# Patient Record
Sex: Female | Born: 1970 | Race: White | Hispanic: No | Marital: Single | Smoking: Never smoker
Health system: Southern US, Community
[De-identification: ages and names within clinical notes are randomized; demographics above are authoritative.]

## PROBLEM LIST (undated history)

## (undated) DIAGNOSIS — F32A Depression, unspecified: Secondary | ICD-10-CM

## (undated) DIAGNOSIS — I10 Essential (primary) hypertension: Secondary | ICD-10-CM

## (undated) DIAGNOSIS — T7840XA Allergy, unspecified, initial encounter: Secondary | ICD-10-CM

## (undated) DIAGNOSIS — Z86718 Personal history of other venous thrombosis and embolism: Secondary | ICD-10-CM

## (undated) DIAGNOSIS — D689 Coagulation defect, unspecified: Secondary | ICD-10-CM

## (undated) DIAGNOSIS — K219 Gastro-esophageal reflux disease without esophagitis: Secondary | ICD-10-CM

## (undated) DIAGNOSIS — F419 Anxiety disorder, unspecified: Secondary | ICD-10-CM

## (undated) DIAGNOSIS — E785 Hyperlipidemia, unspecified: Secondary | ICD-10-CM

## (undated) DIAGNOSIS — E7800 Pure hypercholesterolemia, unspecified: Secondary | ICD-10-CM

## (undated) DIAGNOSIS — F410 Panic disorder [episodic paroxysmal anxiety] without agoraphobia: Secondary | ICD-10-CM

## (undated) DIAGNOSIS — F329 Major depressive disorder, single episode, unspecified: Secondary | ICD-10-CM

## (undated) HISTORY — PX: TONSILLECTOMY: SUR1361

## (undated) HISTORY — PX: BREAST ENHANCEMENT SURGERY: SHX7

## (undated) HISTORY — PX: ABDOMINAL HYSTERECTOMY: SHX81

## (undated) HISTORY — DX: Allergy, unspecified, initial encounter: T78.40XA

## (undated) HISTORY — DX: Coagulation defect, unspecified: D68.9

---

## 2005-07-26 ENCOUNTER — Encounter: Admission: RE | Admit: 2005-07-26 | Discharge: 2005-07-26 | Payer: Self-pay | Admitting: Family Medicine

## 2006-08-17 ENCOUNTER — Encounter: Admission: RE | Admit: 2006-08-17 | Discharge: 2006-08-17 | Payer: Self-pay | Admitting: Internal Medicine

## 2007-05-31 ENCOUNTER — Encounter: Admission: RE | Admit: 2007-05-31 | Discharge: 2007-05-31 | Payer: Self-pay | Admitting: Internal Medicine

## 2007-07-11 ENCOUNTER — Encounter: Admission: RE | Admit: 2007-07-11 | Discharge: 2007-07-11 | Payer: Self-pay | Admitting: *Deleted

## 2008-05-12 ENCOUNTER — Ambulatory Visit (HOSPITAL_COMMUNITY): Admission: RE | Admit: 2008-05-12 | Discharge: 2008-05-12 | Payer: Self-pay | Admitting: *Deleted

## 2008-05-12 ENCOUNTER — Encounter (INDEPENDENT_AMBULATORY_CARE_PROVIDER_SITE_OTHER): Payer: Self-pay | Admitting: *Deleted

## 2008-06-11 ENCOUNTER — Ambulatory Visit: Payer: Self-pay | Admitting: Internal Medicine

## 2008-06-11 ENCOUNTER — Inpatient Hospital Stay (HOSPITAL_COMMUNITY): Admission: EM | Admit: 2008-06-11 | Discharge: 2008-06-18 | Payer: Self-pay | Admitting: Emergency Medicine

## 2008-06-12 ENCOUNTER — Ambulatory Visit: Payer: Self-pay | Admitting: Surgery

## 2008-06-12 ENCOUNTER — Encounter (INDEPENDENT_AMBULATORY_CARE_PROVIDER_SITE_OTHER): Payer: Self-pay | Admitting: Internal Medicine

## 2008-06-19 ENCOUNTER — Emergency Department (HOSPITAL_COMMUNITY): Admission: EM | Admit: 2008-06-19 | Discharge: 2008-06-19 | Payer: Self-pay | Admitting: *Deleted

## 2008-06-19 ENCOUNTER — Telehealth: Payer: Self-pay | Admitting: Internal Medicine

## 2008-07-18 ENCOUNTER — Ambulatory Visit (HOSPITAL_COMMUNITY): Admission: RE | Admit: 2008-07-18 | Discharge: 2008-07-18 | Payer: Self-pay | Admitting: Internal Medicine

## 2008-07-18 ENCOUNTER — Ambulatory Visit: Payer: Self-pay | Admitting: Internal Medicine

## 2008-07-18 DIAGNOSIS — I2699 Other pulmonary embolism without acute cor pulmonale: Secondary | ICD-10-CM | POA: Insufficient documentation

## 2008-07-21 ENCOUNTER — Telehealth: Payer: Self-pay | Admitting: Internal Medicine

## 2008-07-24 ENCOUNTER — Ambulatory Visit: Payer: Self-pay | Admitting: Oncology

## 2008-07-30 ENCOUNTER — Telehealth: Payer: Self-pay | Admitting: Internal Medicine

## 2008-08-15 ENCOUNTER — Encounter: Payer: Self-pay | Admitting: Internal Medicine

## 2008-08-20 ENCOUNTER — Encounter: Payer: Self-pay | Admitting: Internal Medicine

## 2008-08-20 LAB — COMPREHENSIVE METABOLIC PANEL
ALT: 17 U/L (ref 0–35)
Albumin: 4.6 g/dL (ref 3.5–5.2)
CO2: 27 mEq/L (ref 19–32)
Calcium: 9.1 mg/dL (ref 8.4–10.5)
Chloride: 102 mEq/L (ref 96–112)
Potassium: 4.1 mEq/L (ref 3.5–5.3)
Sodium: 139 mEq/L (ref 135–145)
Total Bilirubin: 0.6 mg/dL (ref 0.3–1.2)
Total Protein: 7.2 g/dL (ref 6.0–8.3)

## 2008-08-20 LAB — CBC & DIFF AND RETIC
Basophils Absolute: 0 10*3/uL (ref 0.0–0.1)
Eosinophils Absolute: 0.1 10*3/uL (ref 0.0–0.5)
HGB: 14.1 g/dL (ref 11.6–15.9)
LYMPH%: 26.6 % (ref 14.0–48.0)
MCH: 29.1 pg (ref 26.0–34.0)
MCV: 84.3 fL (ref 81.0–101.0)
MONO%: 7.2 % (ref 0.0–13.0)
NEUT#: 3.3 10*3/uL (ref 1.5–6.5)
NEUT%: 64.5 % (ref 39.6–76.8)
Platelets: 276 10*3/uL (ref 145–400)

## 2008-08-20 LAB — PROTHROMBIN TIME: INR: 2.9 — ABNORMAL HIGH (ref 0.0–1.5)

## 2008-08-26 LAB — BETA-2 GLYCOPROTEIN ANTIBODIES
Beta-2 Glyco I IgG: <4 U/mL (ref <20)
Beta-2-Glycoprotein I IgA: 5 U/mL (ref <10)

## 2008-08-26 LAB — FACTOR 11: Factor XI Activity: 114 % (ref 56–153)

## 2008-08-26 LAB — FACTOR 8 ASSAY: Coagulation Factor VIII: 232 % — ABNORMAL HIGH (ref 73–140)

## 2008-08-26 LAB — LUPUS ANTICOAGULANT PANEL: PTT Lupus Anticoagulant: 58.3 secs — ABNORMAL HIGH (ref 36.3–48.8)

## 2008-08-26 LAB — CARDIOLIPIN ANTIBODIES, IGG, IGM, IGA: Anticardiolipin IgA: <7 [APL'U] (ref <13)

## 2008-08-27 ENCOUNTER — Encounter: Payer: Self-pay | Admitting: Internal Medicine

## 2008-09-08 ENCOUNTER — Ambulatory Visit: Payer: Self-pay | Admitting: Internal Medicine

## 2008-09-08 DIAGNOSIS — R0602 Shortness of breath: Secondary | ICD-10-CM | POA: Insufficient documentation

## 2008-10-22 ENCOUNTER — Ambulatory Visit: Payer: Self-pay | Admitting: Oncology

## 2008-10-24 ENCOUNTER — Encounter: Payer: Self-pay | Admitting: Internal Medicine

## 2008-10-24 LAB — CBC WITH DIFFERENTIAL/PLATELET
BASO%: 0.3 % (ref 0.0–2.0)
Basophils Absolute: 0 10*3/uL (ref 0.0–0.1)
EOS%: 2.3 % (ref 0.0–7.0)
Eosinophils Absolute: 0.2 10*3/uL (ref 0.0–0.5)
HCT: 40.3 % (ref 34.8–46.6)
HGB: 13.8 g/dL (ref 11.6–15.9)
LYMPH%: 24.1 % (ref 14.0–48.0)
MCH: 29.6 pg (ref 26.0–34.0)
MCHC: 34.3 g/dL (ref 32.0–36.0)
MCV: 86.3 fL (ref 81.0–101.0)
MONO#: 0.4 10*3/uL (ref 0.1–0.9)
MONO%: 6.1 % (ref 0.0–13.0)
NEUT#: 4.5 10*3/uL (ref 1.5–6.5)
NEUT%: 67.2 % (ref 39.6–76.8)
Platelets: 263 10*3/uL (ref 145–400)
RBC: 4.67 10*6/uL (ref 3.70–5.32)
RDW: 14.5 % (ref 11.3–14.5)
WBC: 6.7 10*3/uL (ref 3.9–10.0)
lymph#: 1.6 10*3/uL (ref 0.9–3.3)

## 2008-10-24 LAB — D-DIMER, QUANTITATIVE: D-Dimer, Quant: 0.2 ug/mL-FEU (ref 0.00–0.48)

## 2008-10-24 LAB — PROTIME-INR
INR: 3 (ref 2.00–3.50)
Protime: 36 Seconds — ABNORMAL HIGH (ref 10.6–13.4)

## 2008-10-27 LAB — FACTOR 8 ASSAY: Coagulation Factor VIII: 172 % — ABNORMAL HIGH (ref 73–140)

## 2009-04-10 ENCOUNTER — Ambulatory Visit: Payer: Self-pay | Admitting: Oncology

## 2009-04-10 ENCOUNTER — Encounter: Payer: Self-pay | Admitting: Internal Medicine

## 2009-04-10 LAB — CBC WITH DIFFERENTIAL/PLATELET
BASO%: 0.4 % (ref 0.0–2.0)
Basophils Absolute: 0 10*3/uL (ref 0.0–0.1)
EOS%: 1.8 % (ref 0.0–7.0)
Eosinophils Absolute: 0.1 10*3/uL (ref 0.0–0.5)
HCT: 40.2 % (ref 34.8–46.6)
HGB: 14 g/dL (ref 11.6–15.9)
LYMPH%: 25.4 % (ref 14.0–49.7)
MCH: 30.8 pg (ref 25.1–34.0)
MCHC: 34.9 g/dL (ref 31.5–36.0)
MCV: 88.4 fL (ref 79.5–101.0)
MONO#: 0.4 10*3/uL (ref 0.1–0.9)
MONO%: 6 % (ref 0.0–14.0)
NEUT#: 4.1 10*3/uL (ref 1.5–6.5)
NEUT%: 66.4 % (ref 38.4–76.8)
Platelets: 251 10*3/uL (ref 145–400)
RBC: 4.55 10*6/uL (ref 3.70–5.45)
RDW: 13.6 % (ref 11.2–14.5)
WBC: 6.1 10*3/uL (ref 3.9–10.3)
lymph#: 1.5 10*3/uL (ref 0.9–3.3)

## 2009-04-10 LAB — PROTIME-INR
INR: 2.5 (ref 2.00–3.50)
Protime: 30 Seconds — ABNORMAL HIGH (ref 10.6–13.4)

## 2009-04-13 LAB — FACTOR 8 ASSAY: Coagulation Factor VIII: 136 % (ref 73–140)

## 2009-04-20 ENCOUNTER — Encounter: Payer: Self-pay | Admitting: Internal Medicine

## 2009-06-01 ENCOUNTER — Telehealth: Payer: Self-pay | Admitting: Internal Medicine

## 2009-06-16 ENCOUNTER — Encounter: Payer: Self-pay | Admitting: Internal Medicine

## 2009-07-06 ENCOUNTER — Ambulatory Visit: Payer: Self-pay | Admitting: Internal Medicine

## 2009-07-13 ENCOUNTER — Ambulatory Visit (HOSPITAL_COMMUNITY): Admission: RE | Admit: 2009-07-13 | Discharge: 2009-07-13 | Payer: Self-pay | Admitting: Pulmonary Disease

## 2009-07-13 ENCOUNTER — Encounter: Payer: Self-pay | Admitting: Internal Medicine

## 2009-07-28 ENCOUNTER — Telehealth: Payer: Self-pay | Admitting: Internal Medicine

## 2009-07-29 ENCOUNTER — Encounter: Payer: Self-pay | Admitting: Adult Health

## 2009-07-31 ENCOUNTER — Telehealth: Payer: Self-pay | Admitting: Internal Medicine

## 2010-04-15 ENCOUNTER — Ambulatory Visit: Payer: Self-pay | Admitting: Oncology

## 2010-04-19 ENCOUNTER — Encounter: Payer: Self-pay | Admitting: Internal Medicine

## 2010-04-19 LAB — CBC WITH DIFFERENTIAL/PLATELET
BASO%: 0.4 % (ref 0.0–2.0)
Basophils Absolute: 0 10*3/uL (ref 0.0–0.1)
EOS%: 1 % (ref 0.0–7.0)
Eosinophils Absolute: 0.1 10*3/uL (ref 0.0–0.5)
HCT: 42.2 % (ref 34.8–46.6)
HGB: 14.5 g/dL (ref 11.6–15.9)
LYMPH%: 20.7 % (ref 14.0–49.7)
MCH: 30.1 pg (ref 25.1–34.0)
MCHC: 34.4 g/dL (ref 31.5–36.0)
MCV: 87.6 fL (ref 79.5–101.0)
MONO#: 0.4 10*3/uL (ref 0.1–0.9)
MONO%: 5.5 % (ref 0.0–14.0)
NEUT#: 5.3 10*3/uL (ref 1.5–6.5)
NEUT%: 72.4 % (ref 38.4–76.8)
Platelets: 200 10*3/uL (ref 145–400)
RBC: 4.82 10*6/uL (ref 3.70–5.45)
RDW: 13.2 % (ref 11.2–14.5)
WBC: 7.3 10*3/uL (ref 3.9–10.3)
lymph#: 1.5 10*3/uL (ref 0.9–3.3)

## 2010-04-19 LAB — PROTIME-INR
INR: 2.1 (ref 2.00–3.50)
Protime: 25.2 Seconds — ABNORMAL HIGH (ref 10.6–13.4)

## 2010-11-02 NOTE — Letter (Signed)
[facility]   Imported By: Sherian Rein 05/10/2010 09:39:10  _____________________________________________________________________  External Attachment:    Type:   Image     Comment:   External Document

## 2011-02-15 NOTE — Group Therapy Note (Signed)
NAME:  Leah Campos, Leah Campos NO.:  [id number]   MEDICAL RECORD NO.:  [id number]          PATIENT TYPE:  INP   LOCATION:  4742                         FACILITY:  [facility]   PHYSICIAN:  Eduard Clos, MDDATE OF BIRTH:  [birth year]                                 PROGRESS NOTE   INTERIM DISCHARGE SUMMARY   HOSPITAL COURSE:  A 40 year old female who has a history of hyperlipidemia and depression,  who presented complaining of shortness of breath to her primary care  physician with some chest pain, and the patient had a CAT scan of the  chest done as an outpatient, which showed bilateral pulmonary emboli.  The patient admitted for further management.  The patient was started on  Lovenox and Coumadin.  Her lower extremity Dopplers are negative for any  DVT and the patient recently has been changed on her Coumadin dose per  pharmacy.  The patient also requested a pulmonology consult for further  assessment of her present condition, and Dr. Kalman Shan had seen  the patient in consult and recommended a repeat CT chest to evaluate for  her pulmonary effusion, and is going to follow as an outpatient for  further management.  Along with CT chest, Dr. Marchelle Gearing has also ordered  ANA, DNA, ESR, CRP.   The patient is being monitored for her Coumadin dosing and, once that  has been achieved, she will be discharged home with followup with  primary care pulmonologist.  Further recommendations as the patient's  condition evolves.   FINAL DIAGNOSIS:  1. Bilateral pulmonary emboli.  2. History of depression.  3. History of hyperlipidemia.  4. Bilateral pleural effusions.   PLAN:  Further recommendations based on her pending CT chest without contrast  and also blood workup that can be followed outpatient and, once her  Coumadin has been stabilized, the patient to be discharged with a  Coumadin dose, which should be decided by the discharging M.D. at the  time of  discharge.      Eduard Clos, MD  Electronically Signed     ANK/MEDQ  D:  06/17/2008  T:  06/17/2008  Job:  [id number]

## 2011-02-15 NOTE — Op Note (Signed)
Leah Campos, Leah Campos               ACCOUNT NO.:  [id number]   MEDICAL RECORD NO.:  [id number]          PATIENT TYPE:  AMB   LOCATION:  ENDO                         FACILITY[facility]   PHYSICIAN:  Georgiana Spinner, M.D.    DATE OF BIRTH:  11-Aug-1971   DATE OF PROCEDURE:  05/12/2008  DATE OF DISCHARGE:                               OPERATIVE REPORT   PROCEDURE:  Upper endoscopy.   INDICATIONS:  GERD.   ANESTHESIA:  1. Fentanyl 100 mcg.  2. Versed 9 mg.   PROCEDURE:  With the patient mildly sedated in the left lateral  decubitus position, the Pentax videoscopic endoscope was inserted in the  mouth and passed under direct vision through the esophagus, which  appeared normal on first view.  We entered into the stomach.  Fundus,  body, antrum, duodenal bulb, and second portion of the duodenum were  visualized.  From this point, the endoscope was slowly withdrawn, taking  circumferential views of the duodenal mucosa, until the endoscope had  been pulled back and the stomach placed in retroflexion to view the  stomach from below.  The endoscope was straightened and withdrawn,  taking circumferential views of the remaining gastric and esophageal  mucosa, stopping in the fundus of the stomach, where a polyp was seen,  photographed, and biopsied.  We next stopped in the distal esophagus,  where there was a questionable tongue of Barrett's esophagus versus  normal variant that had been pulled back subsequently.  The endoscope  was straightened and withdrawn.  The patient's vital signs and pulse  oximeter remained stable.  The patient tolerated the procedure well,  without apparent complications.   FINDINGS:  Question of Barrett's esophagus, gastric fundic polyp, both  biopsied, and loose wrap of the GE junction around the endoscope,  indicating laxity of the lower esophageal sphincter.  We could see up  the esophagus from the stomach on retroflexed view.   IMPRESSION:  Probable  gastroesophageal reflux, probable benign fundic  gland polyp, probably normal versus Barrett's esophagus seen.  Await  biopsy reports and clinical response to Zegerid.  Have the patient call  me for results and follow up with me as an outpatient.           ______________________________  Georgiana Spinner, M.D.     GMO/MEDQ  D:  05/12/2008  T:  05/12/2008  Job:  [id number]

## 2011-02-15 NOTE — Discharge Summary (Signed)
Leah Campos, Leah Campos               ACCOUNT NO.:  [id number]   MEDICAL RECORD NO.:  [id number]          PATIENT TYPE:  INP   LOCATION:  4742                         FACILITY:  [facility]   PHYSICIAN:  Altha Harm, MDDATE OF BIRTH:  10-Apr-1971   DATE OF ADMISSION:  06/11/2008  DATE OF DISCHARGE:  06/18/2008                               DISCHARGE SUMMARY   DISCHARGE DISPOSITION:  Home.   FINAL DISCHARGE DIAGNOSES:  1. Bilateral pulmonary emboli.  2. Pleural effusion.  3. Hyperlipidemia.  4. Gastroesophageal reflux disease.  5. Seasonal allergic rhinitis.  6. History of depression.   DISCHARGE MEDICATIONS INCLUDED:  1. Celexa 40 mg p.o. daily.  2. Zegerid 40 mg p.o. daily.  3. Lipitor 10 mg p.o. daily.  4. Zyrtec 10 mg p.o. daily p.r.n.  5. Vitamin D3 one tab p.o. daily.  6. Vitamin B complex one tab p.o. daily.  7. Coumadin 2.5 mg p.o. daily.   CONSULTANTS:  Kalman Shan, M.D., pulmonary.   DIAGNOSTIC STUDIES:  1. CT chest without contrast, which showed small bilateral pleural      effusions, left greater than right.  Associated air space disease      may represent atelectasis that extends beyond the area of      diffusion.  An infection or pleural-based disease, such as      asbestosis, is considered.  2. Portable chest x-ray, done on September 14, that shows stable      appearance of pleural effusion and bilateral lower lobe air space      disease.  3. Portable chest x-ray, done on September 12, which shows known      bilateral pleural effusions with worsening bilateral air space      disease.   PRIMARY CARE PHYSICIAN:  Soyla Murphy. Renne Crigler, M.D.   PULMONOLOGIST:  Kalman Shan, M.D.   CHIEF COMPLAINT:  Shortness of breath.   HISTORY OF PRESENT ILLNESS:  Please see the H and P, dictated by Dr.  Sharon Seller at time of admission for details of the HPI.   HOSPITAL COURSE:  The patient was admitted and given supportive care.  The patient was treated with Lovenox and  Coumadin until she became  therapeutic.  The patient has been therapeutic on Coumadin for the last  48 hours, INR 2.8 today and 2.5 yesterday with a dose of 3 mg of  Coumadin given last night.  The patient is now clinically stable.  She  does not require any O2 supplementation.   DIETARY RESTRICTIONS:  The patient should be on a vitamin K restricted  diet.   PHYSICAL RESTRICTIONS:  None.   FOLLOWUP:  The patient is to follow up with Dr. Marchelle Gearing in the office  for an appointment scheduled for October 16, and to follow up with Dr.  Renne Crigler on a p.r.n. basis.   Please note that the patient had been on Seasonique birth control pills,  which have been discontinued at this time.      Altha Harm, MD  Electronically Signed     MAM/MEDQ  D:  06/18/2008  T:  06/18/2008  Job:  [id number]   cc:   Soyla Murphy. Renne Crigler, M.D.  Kalman Shan, MD

## 2011-02-15 NOTE — H&P (Signed)
NAMEEVALISE, ABRUZZESE NO.:  [id number]   MEDICAL RECORD NO.:  [id number]          PATIENT TYPE:  EMS   LOCATION:  MAJO                         FACILITY:  [facility]   PHYSICIAN:  Lonia Blood, M.D.DATE OF BIRTH:  18-Jul-1971   DATE OF ADMISSION:  06/11/2008  DATE OF DISCHARGE:                              HISTORY & PHYSICAL   PRIMARY CARE PHYSICIAN:  Merri Brunette, MD.   CHIEF COMPLAINT:  Multiple bilateral pulmonary emboli.   HISTORY OF PRESENT ILLNESS:  Leah Campos is a very pleasant 40-  year-old female with no significant past medical history with exception  to those few issues detailed below.  She reports somewhat of a rambling  history but she relates that she has had symptoms of shortness of breath  dating back as far as 8 months.  This apparently was worse with  exertion.  She has made multiple visits to her primary care physician  and these have all been investigated to a great extent with different  issues being felt to be related to the source of her symptoms.  Thought  it is exactly hard to pin down when she began to experience chest pain,  she does admit that she had a severe acute onset of left-sided chest  pain, like none she has experienced before last night.  She does admit  to having pinpoint peripheral type pain in both the right and left chest  for many months now, but again does state that the pain she experienced  last night was focused primarily on the left side and was much more  severe than any she has experienced before.  It was a sharp stabbing  pain.  With it was a severe increase in the shortness of breath that she  has been experiencing for many months now.  Due to the fact that her  primary care physician's office is only open for part of the day today,  she presented to the Urgent Care at [facility] around 11:00 a.m.Marland Kitchen  Apparently, the patient was sent from there to [facility]  where a CT scan was done.  The  patient presents with that CT scan which  does reveal bilateral multiple pulmonary emboli.  When the results were  called to Urgent Care, an ambulance was called and the patient was  transported to the emergency room.  She has already received a dose of  Lovenox.  She is somewhat short of breath when attempting to provide her  history, but otherwise appears to be hemodynamically stable.   The patient has no known family history of clotting disorders.  She has  no previous history of blood clots herself.  She has no bleeding  disorders.  She did begin Seasonique (an oral hormone replacement  therapy) approximately 3 months ago and now states, in retrospect, that  she thinks she began having chest pain shortly thereafter.  There has  been no hemoptysis or hematemesis.   REVIEW OF SYSTEMS:  Comprehensive review of systems is unremarkable with  exception to the acute issues noted above.   PAST MEDICAL HISTORY:  1. Gastroesophageal reflux disease status post EGD May 12, 2008,      per Dr. Sabino Gasser.  2. Hypercholesterolemia - patient stopped her therapy herself.  3. Status post partial hysterectomy 1993 secondary to endometriosis.   OUTPATIENT MEDICATIONS:  1. Celexa 40 mg p.o. daily.  2. Zegerid daily.  3. Lipitor - patient stopped herself a month or so ago.  4. Seasonique daily - patient is presently in her third month.   ALLERGIES:  PENICILLIN.   FAMILY HISTORY:  Patient's mother is alive with fibromyalgia and  hypercholesterolemia.  The patient's father is alive with diabetes and  hypertension.  As stated before, there is no known history of blood  clotting or clotting disorders.   SOCIAL HISTORY:  The patient denies smoking now or in the past.  She has  2 healthy children who are both female.  She occasionally partakes of  alcohol.  She is in a steady relationship with a boyfriend.  She works  as an Airline pilot.   DATA REVIEW:  White count is elevated at 11.9 with a normal  hemoglobin,  normal platelet count and normal MCV.  BMET is wholly unremarkable,  coags are normal.  Chest x-ray reveals vague airspace disease at the  left lung base.   PHYSICAL EXAMINATION:  Temperature 99.0, blood pressure 129/87, heart  rate 122, respiratory 21, O2 sat 95% on room air.  GENERAL:  A well-developed, well-nourished female in mild respiratory  distress in that she cannot complete a full sentence without stopping  for a breath, though the patient is not tachypneic.  HEENT:  Normocephalic, atraumatic.  Pupils equal, round, react to light  and accommodation.  Extraocular muscles intact bilaterally.  OC/OP  clear.  NECK:  No JVD, no lymphadenopathy.  LUNGS:  Clear to auscultation bilaterally without wheezing, rhonchi.  CARDIOVASCULAR:  Tachycardic but regular, without gallop or rub, with  normal S1-S2, no murmur.  ABDOMEN:  Nontender, nondistended, soft, bowel sounds present, no  hepatosplenomegaly, no rebound, no ascites.  EXTREMITIES:  No significant cyanosis, clubbing, edema in bilateral  lower extremities with no evidence of erythema, no cords palpable.  NEUROLOGIC:  Alert and oriented x4.  Cranial nerves II-XII intact  bilaterally, 5/5 strength bilateral upper and lower extremities, intact  sensation to touch throughout.   IMPRESSION AND PLAN:  1. Multiple bilateral pulmonary emboli - the patient's history dates      back to a least 8 months and possibly more.  Her history is      somewhat disjointed.  It is essentially impossible to connect the      exact timing of the onset of her symptoms because she has been      complaining of chest and respiratory symptoms for at least 8 months      and possibly a year now.  She has only been on the [place] for 3      months.  Certainly, this appears to be her only obvious risk factor      for pulmonary embolism/deep vein thrombosis at the present time.      It is quite possible, however, that she has been throwing small       pulmonary embolisms for many, many months now even before the      [facility].  In that the patient has already been dosed with      Lovenox, my recommendation is going to be to complete a full 6      months of anticoagulation.  At  the completion of the 6 months, I      would discontinue all anticoagulation and then check the patient      for genetic abnormalities that could lead to hypercoagulable state      as well as a full hypercoagulable workup.  I have discussed with      her the need for a heparin drip with overlap of Coumadin and the      need for her to be in the hospital initially for close monitoring      given her clot burden.  We will obtain bilateral lower extremity      venous Dopplers to assess for the presence of more clot, but at the      present time clinically the patient does not appear to be suffering      with lower extremity DVTs.  Should she display a severe clot      burden, however, we will need to consider the placement of an      inferior vena cava filter.  2. Hypercholesterolemia - we will not resume the patient's Lipitor at      present, but we will check a fasting lipid panel in the morning and      consider resuming this medication as indicated.  3. Gastroesophageal reflux disease - proton pump inhibitor will be      administered during the hospital stay.  4. Leukocytosis - this is felt to be simply an inflammatory response      to the patient's multiple bilateral pulmonary emboli.  We will      follow this trend during hospital stay.      Lonia Blood, M.D.  Electronically Signed     JTM/MEDQ  D:  06/11/2008  T:  06/11/2008  Job:  [id number]   cc:   Soyla Murphy. Renne Crigler, M.D.

## 2011-02-15 NOTE — Consult Note (Signed)
[name], Campos               ACCOUNT NO.:  [id number]   MEDICAL RECORD NO.:  [id number]          PATIENT TYPE:  INP   LOCATION:  4742                         FACILITY:  [facility]   PHYSICIAN:  Kalman Shan, MD   DATE OF BIRTH:  06-09-1971   DATE OF CONSULTATION:  06/17/2008  DATE OF DISCHARGE:                                 CONSULTATION   TIME OF CONSULTATION:  11:10 a.m. to 11:50 a.m., June 17, 2008, 40  minute inpatient consultation.   CONSULTATION REQUESTED BY:  [facility], Eduard Clos, MD.   REASON FOR CONSULTATION:  The patient requesting second opinion on  pulmonary embolus and management, and pleural effusions.   PRIMARY CARE PHYSICIAN:  Soyla Murphy. Renne Crigler, M.D.   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  Two-three months prior to presentation, she was  started on Seasonique which is oral contraceptive pill.  In addition  since April because of the tax season, she has been extremely sedentary  and seated for long hours in her desk job.  She presented to Urgent Care  at [facility] and was shown to have bilateral multiple pulmonary emboli.  I  do not have the CT scan with me but did read the report.  She has been  started on anticoagulation with Lovenox and Coumadin.  Her INRs became  supratherapeutic and now the Coumadin levels are being titrated down, so  she still has been in the hospital.  Hospital course has been largely  uneventful other than the fact between June 11, 2008, a left lower  lobe infiltrate has now progressed on June 14, 2008 to a left lower  lobe effusion with small right lower lobe right-sided effusion.  By  June 16, 2008, seems to have predominantly left greater than right  effusion with some atelectasis.  She is requesting pulmonary  consultation for etiology of pulmonary embolism, management of  pulmonary  embolism, follow up of pulmonary embolism and expectations.   PAST MEDICAL HISTORY:  1. Gastroesophageal reflux disease, status post EGD May 12, 2008 by      Dr. Virginia Rochester.  2. Hyperlipidemia.  3. Status post partial hysterectomy in 1993 secondary to      endometriosis.   OUTPATIENT MEDICATIONS:  1. Celexa.  2. Zegerid.  3. Lipitor.  4. Seasonique.   CURRENT MEDICATIONS:  1. Warfarin.  2. Protonix.  3. Celexa.  4. Senna.  5. Lovenox.  6. Nasonex.  7. Coumadin.  8. Zofran.  9. Antacids p.r.n.  10.Albuterol p.r.n.Marland Kitchen  11.Guaifenesin.  12.Oxycodone p.r.n.  13.Morphine sulfate p.r.n.  14.Ambien p.r.n.  15.Ativan p.r.n.   ALLERGIES:  PENICILLIN.   FAMILY HISTORY:  No family history of any DVTs.  She is of [place].  As best as she can recollect, mother has  fibromyalgia and hyperlipidemia.   SOCIAL HISTORY:  She is an Airline pilot.  She has a sedentary life.  She  was sitting in the chair for many long hours  during tax season in April.  She is in a steady relationship with boyfriend, 2 healthy children, both  are teenagers.  Occasional alcohol.   REVIEW OF SYSTEMS:  As outlined in history of present illness.  Currently, gets tired and washed out after taking a shower.   PHYSICAL EXAMINATION:  VITAL SIGNS:  Temperature 98, pulse of 81,  respiratory rate 20, blood pressure 108/66, saturating 94% on room air.  GENERAL:  Well-built, well-nourished female.  PSYCHIATRIC:  Flat affect.  HEENT:  Neck is supple.  No neck nodes.  No elevated JVP.  Normal oral  cavity.  No ulcers.  No malar rash.  LUNGS:  Clear to auscultation, but decreased at the bases with some  crackles.  CARDIOVASCULAR:  Normal heart sounds.  Regular rate and rhythm.  No  murmurs.  EXTREMITIES:  No cyanosis, no clubbing, no edema.  ABDOMEN:  Soft, nontender.  NEUROLOGICALLY:  Alert and oriented.   DIAGNOSTICS:  1. Chest x-ray is as outlined in the history of present  illness.      Chest x-ray on June 11, 2008 shows small left lower lobe      infiltrate, this has progressed to left greater than right      effusion.  2. CT scan done at [facility] showing bilateral pulmonary      emboli.  I do not have this with me.   LABORATORY DATA:  Current INR is 2.5.  BMET shows a normal creatinine of  0.6.  Homocysteine levels are normal at 3.7.  CBC shows normal  hemoglobin 11.6 and platelet count of 285.  Beta hCG on June 11, 2008 was negative for pregnancy.  BNP on  June 11, 2008 was normal at 34.   ASSESSMENT/PLAN:  1. Bilateral multiple pulmonary emboli with a normal BNP, normal blood      pressure and normal oxygen levels, being currently treated on      Lovenox with Coumadin.  Currently awaiting discharge once INR      levels are stable.  Coumadin management being done by pharmacy.      Doppler of the legs is negative.  2. First episode of pulmonary embolism.  Risk factors are ethnicity,      sedentary lifestyle because of recent tax season and also being on      [place].  However,  symptoms preceded being on Seasonique oral      contraceptive pill.  3. No evidence of lupus.  No evidence of clinical malignancy.  The      last mammogram was many years ago.  4. Bilateral L>R effusions - likely from pulmonary embolism   PLAN:  1. Would continue with Coumadin for at least 9 months.  2. Post-Coumadin therapy:  Recommend hematology consultation for      evaluating for any clotting disorders resulting in pulmonary      embolism.  3. No more Seasonique.  4. Outpatient pulmonary followup for monitoring for dyspnea and a 5%      incidence of pulmonary hypertension.  5. No indication to do advanced malignancy screening other than      routine mammogram and  stool occult blood.  6. For current pleural effusions, would recommend checking BNP,      troponin and another CT scan of the chest to get a better sense of      the pleural  effusions.  7. [place] incentive spirometer 10 times every 1 hour to get rid of  bilateral atelectasis and to prevent pneumonia formation.  I      answered all her questions.  This is outlined in my plan.   FOLLOW UP:  She should follow up with me in 1 month, Kalman Shan,  MD at [address] at [facility].  Phone number [phone].  Will follow as inpatient  Need outside CT chest to confirm PE diagnosis.      Kalman Shan, MD  Electronically Signed     MR/MEDQ  D:  06/17/2008  T:  06/17/2008  Job:  [id number]   cc:   Lonia Blood, M.D.  Eduard Clos, MD  Soyla Murphy. Renne Crigler, M.D.

## 2011-07-04 LAB — CBC
HCT: 40.9
Hemoglobin: 13.7
MCHC: 33.5
MCHC: 34
MCV: 85.4
Platelets: 468 — ABNORMAL HIGH
RDW: 12.7
RDW: 12.9

## 2011-07-04 LAB — POCT CARDIAC MARKERS: CKMB, poc: <1 — ABNORMAL LOW

## 2011-07-04 LAB — BASIC METABOLIC PANEL
BUN: 15
CO2: 24
Chloride: 108
Creatinine, Ser: 0.7

## 2011-07-04 LAB — B-NATRIURETIC PEPTIDE (CONVERTED LAB): Pro B Natriuretic peptide (BNP): <30

## 2011-07-04 LAB — PROTIME-INR
INR: 2.5 — ABNORMAL HIGH
INR: 2.5 — ABNORMAL HIGH
Prothrombin Time: 29.1 — ABNORMAL HIGH

## 2011-07-04 LAB — ANA: Anti Nuclear Antibody(ANA): NEGATIVE

## 2011-07-04 LAB — DIFFERENTIAL
Basophils Absolute: 0
Basophils Relative: 1
Eosinophils Absolute: 0.2
Neutrophils Relative %: 62

## 2011-07-04 LAB — SEDIMENTATION RATE: Sed Rate: 20

## 2011-07-04 LAB — ANTI-NEUTROPHIL ANTIBODY

## 2011-07-06 LAB — BASIC METABOLIC PANEL
BUN: 3 — ABNORMAL LOW
CO2: 25
CO2: 26
Calcium: 8.1 — ABNORMAL LOW
Calcium: 8.8
Chloride: 104
Chloride: 105
GFR calc Af Amer: >60
GFR calc Af Amer: >60
GFR calc Af Amer: >60
GFR calc non Af Amer: >60
Glucose, Bld: 105 — ABNORMAL HIGH
Potassium: 3.9
Potassium: 4.2
Sodium: 136
Sodium: 137

## 2011-07-06 LAB — CBC
HCT: 32.6 — ABNORMAL LOW
HCT: 34 — ABNORMAL LOW
Hemoglobin: 11.1 — ABNORMAL LOW
Hemoglobin: 12.1
Hemoglobin: 13.5
MCHC: 33.7
MCHC: 34
MCV: 86.8
Platelets: 285
RBC: 3.75 — ABNORMAL LOW
RBC: 3.92
RBC: 4.05
RBC: 4.56
RDW: 13.1
WBC: 4.7

## 2011-07-06 LAB — COMPREHENSIVE METABOLIC PANEL
ALT: 15
Alkaline Phosphatase: 59
BUN: 5 — ABNORMAL LOW
CO2: 26
GFR calc non Af Amer: >60
Glucose, Bld: 96
Potassium: 4.5
Sodium: 136
Total Bilirubin: 1.2
Total Protein: 6.1

## 2011-07-06 LAB — URINALYSIS, ROUTINE W REFLEX MICROSCOPIC
Leukocytes, UA: NEGATIVE
Nitrite: NEGATIVE
Specific Gravity, Urine: >1.046 — ABNORMAL HIGH
Urobilinogen, UA: 1

## 2011-07-06 LAB — MAGNESIUM: Magnesium: 2.1

## 2011-07-06 LAB — DIFFERENTIAL
Basophils Relative: 0
Monocytes Absolute: 0.4
Monocytes Relative: 3
Neutro Abs: 10.5 — ABNORMAL HIGH

## 2011-07-06 LAB — URINE MICROSCOPIC-ADD ON

## 2011-07-06 LAB — LIPID PANEL
Total CHOL/HDL Ratio: 5.4
VLDL: 24

## 2011-07-06 LAB — HCG, SERUM, QUALITATIVE: Preg, Serum: NEGATIVE

## 2011-07-06 LAB — PROTIME-INR
INR: 1.8 — ABNORMAL HIGH
INR: 2.1 — ABNORMAL HIGH
Prothrombin Time: 20.4 — ABNORMAL HIGH
Prothrombin Time: 21.5 — ABNORMAL HIGH

## 2011-07-06 LAB — HOMOCYSTEINE: Homocysteine: 3.7 — ABNORMAL LOW

## 2011-07-06 LAB — APTT: aPTT: 24

## 2011-11-29 ENCOUNTER — Encounter (HOSPITAL_COMMUNITY): Payer: Self-pay | Admitting: *Deleted

## 2011-11-29 ENCOUNTER — Emergency Department (HOSPITAL_COMMUNITY)
Admission: EM | Admit: 2011-11-29 | Discharge: 2011-11-29 | Disposition: A | Discharge status: Home / Self Care | Payer: BC Managed Care – PPO | Attending: Emergency Medicine | Admitting: Emergency Medicine

## 2011-11-29 ENCOUNTER — Emergency Department (HOSPITAL_COMMUNITY): Payer: BC Managed Care – PPO

## 2011-11-29 DIAGNOSIS — Z7901 Long term (current) use of anticoagulants: Secondary | ICD-10-CM | POA: Insufficient documentation

## 2011-11-29 DIAGNOSIS — E7800 Pure hypercholesterolemia, unspecified: Secondary | ICD-10-CM | POA: Insufficient documentation

## 2011-11-29 DIAGNOSIS — Z86718 Personal history of other venous thrombosis and embolism: Secondary | ICD-10-CM | POA: Insufficient documentation

## 2011-11-29 DIAGNOSIS — R509 Fever, unspecified: Secondary | ICD-10-CM | POA: Insufficient documentation

## 2011-11-29 DIAGNOSIS — R109 Unspecified abdominal pain: Secondary | ICD-10-CM | POA: Insufficient documentation

## 2011-11-29 DIAGNOSIS — R197 Diarrhea, unspecified: Secondary | ICD-10-CM

## 2011-11-29 DIAGNOSIS — R10817 Generalized abdominal tenderness: Secondary | ICD-10-CM | POA: Insufficient documentation

## 2011-11-29 HISTORY — DX: Gastro-esophageal reflux disease without esophagitis: K21.9

## 2011-11-29 HISTORY — DX: Panic disorder (episodic paroxysmal anxiety): F41.0

## 2011-11-29 HISTORY — DX: Personal history of other venous thrombosis and embolism: Z86.718

## 2011-11-29 HISTORY — DX: Pure hypercholesterolemia, unspecified: E78.00

## 2011-11-29 HISTORY — DX: Anxiety disorder, unspecified: F41.9

## 2011-11-29 HISTORY — DX: Major depressive disorder, single episode, unspecified: F32.9

## 2011-11-29 HISTORY — DX: Depression, unspecified: F32.A

## 2011-11-29 LAB — COMPREHENSIVE METABOLIC PANEL WITH GFR
ALT: 41 U/L — ABNORMAL HIGH (ref 0–35)
AST: 22 U/L (ref 0–37)
Albumin: 4.3 g/dL (ref 3.5–5.2)
Alkaline Phosphatase: 56 U/L (ref 39–117)
BUN: 11 mg/dL (ref 6–23)
CO2: 24 meq/L (ref 19–32)
Calcium: 8.9 mg/dL (ref 8.4–10.5)
Chloride: 100 meq/L (ref 96–112)
Creatinine, Ser: 0.63 mg/dL (ref 0.50–1.10)
GFR calc Af Amer: >90 mL/min
GFR calc non Af Amer: >90 mL/min
Glucose, Bld: 104 mg/dL — ABNORMAL HIGH (ref 70–99)
Potassium: 3.2 meq/L — ABNORMAL LOW (ref 3.5–5.1)
Sodium: 135 meq/L (ref 135–145)
Total Bilirubin: 0.7 mg/dL (ref 0.3–1.2)
Total Protein: 7 g/dL (ref 6.0–8.3)

## 2011-11-29 LAB — CBC
HCT: 41.8 % (ref 36.0–46.0)
Hemoglobin: 14.6 g/dL (ref 12.0–15.0)
MCH: 30 pg (ref 26.0–34.0)
MCHC: 34.9 g/dL (ref 30.0–36.0)
MCV: 86 fL (ref 78.0–100.0)
Platelets: 195 K/uL (ref 150–400)
RBC: 4.86 MIL/uL (ref 3.87–5.11)
RDW: 13 % (ref 11.5–15.5)
WBC: 5.5 K/uL (ref 4.0–10.5)

## 2011-11-29 LAB — DIFFERENTIAL
Basophils Absolute: 0 K/uL (ref 0.0–0.1)
Basophils Relative: 0 % (ref 0–1)
Eosinophils Absolute: 0 K/uL (ref 0.0–0.7)
Eosinophils Relative: 0 % (ref 0–5)
Lymphocytes Relative: 13 % (ref 12–46)
Lymphs Abs: 0.7 K/uL (ref 0.7–4.0)
Monocytes Absolute: 0.4 K/uL (ref 0.1–1.0)
Monocytes Relative: 7 % (ref 3–12)
Neutro Abs: 4.4 K/uL (ref 1.7–7.7)
Neutrophils Relative %: 79 % — ABNORMAL HIGH (ref 43–77)

## 2011-11-29 LAB — URINALYSIS, ROUTINE W REFLEX MICROSCOPIC
Bilirubin Urine: NEGATIVE
Hgb urine dipstick: NEGATIVE
Ketones, ur: >80 mg/dL — AB
Protein, ur: 100 mg/dL — AB
Specific Gravity, Urine: 1.03 (ref 1.005–1.030)
Urobilinogen, UA: 1 mg/dL (ref 0.0–1.0)

## 2011-11-29 LAB — URINE MICROSCOPIC-ADD ON

## 2011-11-29 MED ORDER — SODIUM CHLORIDE 0.9 % IV SOLN
Freq: Once | INTRAVENOUS | Status: AC
  Administered 2011-11-29: 20:00:00 via INTRAVENOUS

## 2011-11-29 MED ORDER — SODIUM CHLORIDE 0.9 % IV SOLN
Freq: Once | INTRAVENOUS | Status: AC
  Administered 2011-11-29: 18:00:00 via INTRAVENOUS

## 2011-11-29 MED ORDER — ONDANSETRON HCL 4 MG/2ML IJ SOLN
4.0000 mg | Freq: Once | INTRAMUSCULAR | Status: AC
  Administered 2011-11-29: 4 mg via INTRAVENOUS
  Filled 2011-11-29: qty 2

## 2011-11-29 MED ORDER — DICYCLOMINE HCL 20 MG PO TABS: 20.0000 mg | ORAL_TABLET | Freq: Four times a day (QID) | ORAL | Status: DC | PRN

## 2011-11-29 MED ORDER — POTASSIUM CHLORIDE CRYS ER 20 MEQ PO TBCR
40.0000 meq | EXTENDED_RELEASE_TABLET | Freq: Once | ORAL | Status: AC
  Administered 2011-11-29: 40 meq via ORAL
  Filled 2011-11-29: qty 2

## 2011-11-29 MED ORDER — IOHEXOL 300 MG/ML  SOLN
100.0000 mL | Freq: Once | INTRAMUSCULAR | Status: AC | PRN
  Administered 2011-11-29: 100 mL via INTRAVENOUS

## 2011-11-29 MED ORDER — HYDROMORPHONE HCL PF 1 MG/ML IJ SOLN
1.0000 mg | Freq: Once | INTRAMUSCULAR | Status: AC
  Administered 2011-11-29: 1 mg via INTRAVENOUS
  Filled 2011-11-29: qty 1

## 2011-11-29 NOTE — ED Notes (Signed)
Pt states "thought I had the stomach bug, had diarrhea, went to the MD today, they sent me here, think it's appendicitis, had chills yesterday, pain is constant but when it worsens I want to throw up"

## 2011-11-29 NOTE — Discharge Instructions (Signed)
Please follow up with your primary care provider or return to the ER if you continue to have pain or any of your symptoms get worse.  Please use the medication as prescribed.  Drink plenty of fluids, especially water and Gatorade.  You may return to the ER at any time for worsening condition or any new symptoms that concern you.    Abdominal Pain Abdominal pain can be caused by many things. Your caregiver decides the seriousness of your pain by an examination and possibly blood tests and X-rays. Many cases can be observed and treated at home. Most abdominal pain is not caused by a disease and will probably improve without treatment. However, in many cases, more time must pass before a clear cause of the pain can be found. Before that point, it may not be known if you need more testing, or if hospitalization or surgery is needed. HOME CARE INSTRUCTIONS   Do not take laxatives unless directed by your caregiver.   Take pain medicine only as directed by your caregiver.   Only take over-the-counter or prescription medicines for pain, discomfort, or fever as directed by your caregiver.   Try a clear liquid diet (broth, tea, or water) for as long as directed by your caregiver. Slowly move to a bland diet as tolerated.  SEEK IMMEDIATE MEDICAL CARE IF:   The pain does not go away.   You have a fever.   You keep throwing up (vomiting).   The pain is felt only in portions of the abdomen. Pain in the right side could possibly be appendicitis. In an adult, pain in the left lower portion of the abdomen could be colitis or diverticulitis.   You pass bloody or black tarry stools.  MAKE SURE YOU:   Understand these instructions.   Will watch your condition.   Will get help right away if you are not doing well or get worse.  Document Released: 06/29/2005 Document Revised: 06/01/2011 Document Reviewed: 05/07/2008 [facility] Patient Information 2012 [place], [place].B.R.A.T. Diet Your doctor has  recommended the B.R.A.T. diet for you or your child until the condition improves. This is often used to help control diarrhea and vomiting symptoms. If you or your child can tolerate clear liquids, you may have:  Bananas.   Rice.   Applesauce.   Toast (and other simple starches such as crackers, potatoes, noodles).  Be sure to avoid dairy products, meats, and fatty foods until symptoms are better. Fruit juices such as apple, grape, and prune juice can make diarrhea worse. Avoid these. Continue this diet for 2 days or as instructed by your caregiver. Document Released: 09/19/2005 Document Revised: 06/01/2011 Document Reviewed: 03/08/2007 [facility] Patient Information 2012 [place], [place].Diet for Diarrhea, Adult Having frequent, runny stools (diarrhea) has many causes. Diarrhea may be caused or worsened by food or drink. Diarrhea may be relieved by changing your diet. IF YOU ARE NOT TOLERATING SOLID FOODS:  Drink enough water and fluids to keep your urine clear or pale yellow.   Avoid sugary drinks and sodas as well as milk-based beverages.   Avoid beverages containing caffeine and alcohol.   You may try rehydrating beverages. You can make your own by following this recipe:    tsp table salt.    tsp baking soda.   ? tsp salt substitute (potassium chloride).   1 tbs + 1 tsp sugar.   1 qt water.  As your stools become more solid, you can start eating solid foods. Add foods one at a  time. If a certain food causes your diarrhea to get worse, avoid that food and try other foods. A low fiber, low-fat, and lactose-free diet is recommended. Small, frequent meals may be better tolerated.  Starches  Allowed:  [place], [place], and pita breads, plain rolls, buns, bagels. Plain muffins, matzo. Soda, saltine, or graham crackers. Pretzels, melba toast, zwieback. Cooked cereals made with water: cornmeal, farina, cream cereals. Dry cereals: refined corn, wheat, rice. Potatoes prepared any way without  skins, refined macaroni, spaghetti, noodles, refined rice.   Avoid:  Bread, rolls, or crackers made with whole wheat, multi-grains, rye, bran seeds, nuts, or coconut. Corn tortillas or taco shells. Cereals containing whole grains, multi-grains, bran, coconut, nuts, or raisins. Cooked or dry oatmeal. Coarse wheat cereals, granola. Cereals advertised as "high-fiber." Potato skins. Whole grain pasta, wild or brown rice. Popcorn. Sweet potatoes/yams. Sweet rolls, doughnuts, waffles, pancakes, sweet breads.  Vegetables  Allowed: Strained tomato and vegetable juices. Most well-cooked and canned vegetables without seeds. Fresh: Tender lettuce, cucumber without the skin, cabbage, spinach, bean sprouts.   Avoid: Fresh, cooked, or canned: Artichokes, baked beans, beet greens, broccoli, Brussels sprouts, corn, kale, legumes, peas, sweet potatoes. Cooked: Green or red cabbage, spinach. Avoid large servings of any vegetables, because vegetables shrink when cooked, and they contain more fiber per serving than fresh vegetables.  Fruit  Allowed: All fruit juices except prune juice. Cooked or canned: Apricots, applesauce, cantaloupe, cherries, fruit cocktail, grapefruit, grapes, kiwi, mandarin oranges, peaches, pears, plums, watermelon. Fresh: Apples without skin, ripe banana, grapes, cantaloupe, cherries, grapefruit, peaches, oranges, plums. Keep servings limited to  cup or 1 piece.   Avoid: Fresh: Apple with skin, apricots, mango, pears, raspberries, strawberries. Prune juice, stewed or dried prunes. Dried fruits, raisins, dates. Large servings of all fresh fruits.  Meat and Meat Substitutes  Allowed: Ground or well-cooked tender beef, ham, veal, lamb, pork, or poultry. Eggs, plain cheese. Fish, oysters, shrimp, lobster, other seafoods. Liver, organ meats.   Avoid: Tough, fibrous meats with gristle. Peanut butter, smooth or chunky. Cheese, nuts, seeds, legumes, dried peas, beans, lentils.  Milk  Allowed:  Yogurt, lactose-free milk, kefir, drinkable yogurt, buttermilk, soy milk.   Avoid: Milk, chocolate milk, beverages made with milk, such as milk shakes.  Soups  Allowed: Bouillon, broth, or soups made from allowed foods. Any strained soup.   Avoid: Soups made from vegetables that are not allowed, cream or milk-based soups.  Desserts and Sweets  Allowed: Sugar-free gelatin, sugar-free frozen ice pops made without sugar alcohol.   Avoid: Plain cakes and cookies, pie made with allowed fruit, pudding, custard, cream pie. Gelatin, fruit, ice, sherbet, frozen ice pops. Ice cream, ice milk without nuts. Plain hard candy, honey, jelly, molasses, syrup, sugar, chocolate syrup, gumdrops, marshmallows.  Fats and Oils  Allowed: Avoid any fats and oils.   Avoid: Seeds, nuts, olives, avocados. Margarine, butter, cream, mayonnaise, salad oils, plain salad dressings made from allowed foods. Plain gravy, crisp bacon without rind.  Beverages  Allowed: Water, decaffeinated teas, oral rehydration solutions, sugar-free beverages.   Avoid: Fruit juices, caffeinated beverages (coffee, tea, soda or pop), alcohol, sports drinks, or lemon-lime soda or pop.  Condiments  Allowed: Ketchup, mustard, horseradish, vinegar, cream sauce, cheese sauce, cocoa powder. Spices in moderation: allspice, basil, bay leaves, celery powder or leaves, cinnamon, cumin powder, curry powder, ginger, mace, marjoram, onion or garlic powder, oregano, paprika, parsley flakes, ground pepper, rosemary, sage, savory, tarragon, thyme, turmeric.   Avoid: Coconut, honey.  Weight Monitoring: Weigh yourself  every day. You should weigh yourself in the morning after you urinate and before you eat breakfast. Wear the same amount of clothing when you weigh yourself. Record your weight daily. Bring your recorded weights to your clinic visits. Tell your caregiver right away if you have gained 3 lb/1.4 kg or more in 1 day, 5 lb/2.3 kg in a week, or  whatever amount you were told to report. SEEK IMMEDIATE MEDICAL CARE IF:   You are unable to keep fluids down.   You start to throw up (vomit) or diarrhea keeps coming back (persistent).   Abdominal pain develops, increases, or can be felt in one place (localizes).   You have an oral temperature above 102 F (38.9 C), not controlled by medicine.   Diarrhea contains blood or mucus.   You develop excessive weakness, dizziness, fainting, or extreme thirst.  MAKE SURE YOU:   Understand these instructions.   Will watch your condition.   Will get help right away if you are not doing well or get worse.  Document Released: 12/10/2003 Document Revised: 06/01/2011 Document Reviewed: 04/02/2009 [facility] Patient Information 2012 [place], [place].Diarrhea Infections caused by germs (bacterial) or a virus commonly cause diarrhea. Your caregiver has determined that with time, rest and fluids, the diarrhea should improve. In general, eat normally while drinking more water than usual. Although water may prevent dehydration, it does not contain salt and minerals (electrolytes). Broths, weak tea without caffeine and oral rehydration solutions (ORS) replace fluids and electrolytes. Small amounts of fluids should be taken frequently. Large amounts at one time may not be tolerated. Plain water may be harmful in infants and the elderly. Oral rehydrating solutions (ORS) are available at pharmacies and grocery stores. ORS replace water and important electrolytes in proper proportions. Sports drinks are not as effective as ORS and may be harmful due to sugars worsening diarrhea.  ORS is especially recommended for use in children with diarrhea. As a general guideline for children, replace any new fluid losses from diarrhea and/or vomiting with ORS as follows:   If your child weighs 22 pounds or under (10 kg or less), give 60-120 mL ( -  cup or 2 - 4 ounces) of ORS for each episode of diarrheal stool or vomiting  episode.   If your child weighs more than 22 pounds (more than 10 kgs), give 120-240 mL ( - 1 cup or 4 - 8 ounces) of ORS for each diarrheal stool or episode of vomiting.   While correcting for dehydration, children should eat normally. However, foods high in sugar should be avoided because this may worsen diarrhea. Large amounts of carbonated soft drinks, juice, gelatin desserts and other highly sugared drinks should be avoided.   After correction of dehydration, other liquids that are appealing to the child may be added. Children should drink small amounts of fluids frequently and fluids should be increased as tolerated. Children should drink enough fluids to keep urine clear or pale yellow.   Adults should eat normally while drinking more fluids than usual. Drink small amounts of fluids frequently and increase as tolerated. Drink enough fluids to keep urine clear or pale yellow. Broths, weak decaffeinated tea, lemon lime soft drinks (allowed to go flat) and ORS replace fluids and electrolytes.   Avoid:   Carbonated drinks.   Juice.   Extremely hot or cold fluids.   Caffeine drinks.   Fatty, greasy foods.   Alcohol.   Tobacco.   Too much intake of anything at one time.  Gelatin desserts.   Probiotics are active cultures of beneficial bacteria. They may lessen the amount and number of diarrheal stools in adults. Probiotics can be found in yogurt with active cultures and in supplements.   Wash hands well to avoid spreading bacteria and virus.   Anti-diarrheal medications are not recommended for infants and children.   Only take over-the-counter or prescription medicines for pain, discomfort or fever as directed by your caregiver. Do not give aspirin to children because it may cause Reye's Syndrome.   For adults, ask your caregiver if you should continue all prescribed and over-the-counter medicines.   If your caregiver has given you a follow-up appointment, it is very  important to keep that appointment. Not keeping the appointment could result in a chronic or permanent injury, and disability. If there is any problem keeping the appointment, you must call back to this facility for assistance.  SEEK IMMEDIATE MEDICAL CARE IF:   You or your child is unable to keep fluids down or other symptoms or problems become worse in spite of treatment.   Vomiting or diarrhea develops and becomes persistent.   There is vomiting of blood or bile (green material).   There is blood in the stool or the stools are black and tarry.   There is no urine output in 6-8 hours or there is only a small amount of very dark urine.   Abdominal pain develops, increases or localizes.   You have a fever.   Your baby is older than 3 months with a rectal temperature of 102 F (38.9 C) or higher.   Your baby is 3 months old or younger with a rectal temperature of 100.4 F (38 C) or higher.   You or your child develops excessive weakness, dizziness, fainting or extreme thirst.   You or your child develops a rash, stiff neck, severe headache or become irritable or sleepy and difficult to awaken.  MAKE SURE YOU:   Understand these instructions.   Will watch your condition.   Will get help right away if you are not doing well or get worse.  Document Released: 09/09/2002 Document Revised: 06/01/2011 Document Reviewed: 07/27/2009 [facility] Patient Information 2012 [place], [place].

## 2011-11-29 NOTE — ED Provider Notes (Signed)
History     CSN: [id number]  Arrival date & time 11/29/11  1557   First MD Initiated Contact with Patient 11/29/11 1732      Chief Complaint  Patient presents with  . Abdominal Pain    (Consider location/radiation/quality/duration/timing/severity/associated sxs/prior treatment) HPI Comments: Patient reports she started having diffuse abdominal pain, mostly around periumbilical area, yesterday with associated diarrhea.  Pain has steadily increased, is described as "sore."  She also started having "burning" back pain today.  Associated decreased appetite.  Last BM was this morning and described as "soft" "unformed" stool.  Last bowel movement was Denies fevers, N/V, hematochezia, melena.  Denies urinary or vaginal symptoms.    Patient is a 41 y.o. female presenting with abdominal pain. The history is provided by the patient.  Abdominal Pain The primary symptoms of the illness include abdominal pain. The primary symptoms of the illness do not include fever, shortness of breath or dysuria.    Past Medical History  Diagnosis Date  . H/O blood clots   . Acid reflux   . Depression   . Anxiety   . Panic attacks   . Hypercholesteremia     Past Surgical History  Procedure Date  . Abdominal hysterectomy     partial  . Tonsillectomy   . Breast enhancement surgery     History reviewed. No pertinent family history.  History  Substance Use Topics  . Smoking status: Never Smoker   . Smokeless tobacco: Not on file  . Alcohol Use: Yes     rarely    OB History    Grav Para Term Preterm Abortions TAB SAB Ect Mult Living                  Review of Systems  Constitutional: Positive for appetite change. Negative for fever.  Respiratory: Negative for shortness of breath.   Cardiovascular: Negative for chest pain.  Gastrointestinal: Positive for abdominal pain.  Genitourinary: Negative for dysuria and difficulty urinating.  All other systems reviewed and are  negative.    Allergies  Codeine and Penicillins  Home Medications   Current Outpatient Rx  Name Route Sig Dispense Refill  . BUPROPION HCL ER (SR) 150 MG PO TB12 Oral Take 150 mg by mouth 2 (two) times daily.    . DULOXETINE HCL 60 MG PO CPEP Oral Take 60 mg by mouth daily.    Marland Kitchen ESZOPICLONE 2 MG PO TABS Oral Take 2 mg by mouth at bedtime as needed. Take immediately before bedtime    . ZEGERID OTC PO Oral Take 40 mg by mouth daily.    Marland Kitchen ROSUVASTATIN CALCIUM 10 MG PO TABS Oral Take 10 mg by mouth 2 (two) times a week. Patient takes on Wednesday and Sunday ONLY    . WARFARIN SODIUM 5 MG PO TABS Oral Take 2.5-5 mg by mouth daily. Patient takes 5mg  every day except 2.5mg  on Saturday.      BP 128/89  Pulse 101  Temp 99 F (37.2 C)  Resp 20  Wt 138 lb (62.596 kg)  SpO2 99%  Physical Exam  Nursing note and vitals reviewed. Constitutional: She is oriented to person, place, and time. She appears well-developed and well-nourished.  HENT:  Head: Normocephalic and atraumatic.  Neck: Neck supple.  Cardiovascular: Normal rate, regular rhythm and normal heart sounds.   Pulmonary/Chest: Breath sounds normal. No respiratory distress. She has no wheezes. She has no rales. She exhibits no tenderness.  Abdominal: Soft. Bowel sounds are normal.  She exhibits no distension and no mass. There is generalized tenderness. There is no rebound, no guarding and no CVA tenderness.  Neurological: She is alert and oriented to person, place, and time.    ED Course  Procedures (including critical care time)  Labs Reviewed  DIFFERENTIAL - Abnormal; Notable for the following:    Neutrophils Relative 79 (*)    All other components within normal limits  COMPREHENSIVE METABOLIC PANEL - Abnormal; Notable for the following:    Potassium 3.2 (*)    Glucose, Bld 104 (*)    ALT 41 (*)    All other components within normal limits  URINALYSIS, ROUTINE W REFLEX MICROSCOPIC - Abnormal; Notable for the following:     Color, Urine AMBER (*) BIOCHEMICALS MAY BE AFFECTED BY COLOR   APPearance CLOUDY (*)    pH 8.5 (*)    Ketones, ur >80 (*)    Protein, ur 100 (*)    All other components within normal limits  URINE MICROSCOPIC-ADD ON - Abnormal; Notable for the following:    Squamous Epithelial / LPF FEW (*)    Casts HYALINE CASTS (*)    All other components within normal limits  CBC  LIPASE, BLOOD   Ct Abdomen Pelvis W Contrast  11/29/2011  *RADIOLOGY REPORT*  Clinical Data: Abdominal pain.  Fever and chills.  Diarrhea. Clinical suspicion for appendicitis.  CT ABDOMEN AND PELVIS WITH CONTRAST  Technique:  Multidetector CT imaging of the abdomen and pelvis was performed following the standard protocol during bolus administration of intravenous contrast.  Contrast:  100 ml Omnipaque-300 and oral contrast  Comparison: None.  Findings: Normal appendix is visualized.  No other inflammatory process or abnormal fluid collections are seen within the abdomen or pelvis.  There is no evidence of bowel wall thickening or dilatation.  The abdominal parenchymal organs are normal in appearance. Gallbladder appears normal.  No evidence of hydronephrosis.  No soft tissue masses or lymphadenopathy identified within the abdomen or pelvis.  Previous hysterectomy noted.  Adnexa are unremarkable in appearance.  IMPRESSION: Negative.  No evidence of appendicitis or other significant abnormality.  Original Report Authenticated By: Danae Orleans, M.D.   Patient is on coumadin for hx PE, patient's INR last checked 4 days ago, was 2.9.  Pt states she has been on coumadin since 2009 and only has her INR level checked once a month because her level is very stable.    1. Abdominal pain   2. Diarrhea       MDM  Afebrile, nontoxic patient with diffuse abdominal pain, decreased appetite, that began with diarrhea.  Pt sent from PCP to r/o appendicitis. Pt found to have mild hypokalemia, dehydration per urinalysis, CT without any acute  pathology.  Possible viral etiology.  Pt given pain, nausea medication, IVF.  D/C home with medication for abdominal cramping, advised to drink plenty of fluids and rest, advised to return for any worsening condition or any new symptoms that concern her.  Patient verbalizes understanding and agrees with plan.          Dillard Cannon [place], [place] 11/29/11 2022

## 2011-11-29 NOTE — ED Provider Notes (Signed)
Medical screening examination/treatment/procedure(s) were performed by non-physician practitioner and as supervising physician I was immediately available for consultation/collaboration. Laken Rog Y.   Gavin Pound. Oletta Lamas, MD 11/29/11 2023

## 2011-11-29 NOTE — ED Notes (Signed)
Pt. Ambulatory with steady gait with family. Respirations equal and unlabored. Skin warm and dry. No acute distress noted 

## 2011-11-29 NOTE — ED Notes (Signed)
Patient in radiology.will obtain labs upon patient's return 

## 2012-12-07 ENCOUNTER — Other Ambulatory Visit: Payer: Self-pay | Admitting: Oncology

## 2012-12-07 ENCOUNTER — Telehealth: Payer: Self-pay | Admitting: Oncology

## 2012-12-07 DIAGNOSIS — I2699 Other pulmonary embolism without acute cor pulmonale: Secondary | ICD-10-CM

## 2012-12-07 NOTE — Telephone Encounter (Signed)
Talked to pt and gave her appt for June 2014 lab and MD

## 2013-03-21 ENCOUNTER — Other Ambulatory Visit: Payer: BC Managed Care – PPO

## 2013-03-25 ENCOUNTER — Telehealth: Payer: Self-pay | Admitting: Oncology

## 2013-03-25 ENCOUNTER — Ambulatory Visit (HOSPITAL_BASED_OUTPATIENT_CLINIC_OR_DEPARTMENT_OTHER): Payer: BC Managed Care – PPO | Admitting: Oncology

## 2013-03-25 VITALS — BP 124/82 | HR 106 | Temp 99.6°F | Resp 18 | Ht 64.0 in | Wt 147.4 lb

## 2013-03-25 DIAGNOSIS — I2699 Other pulmonary embolism without acute cor pulmonale: Secondary | ICD-10-CM

## 2013-03-25 NOTE — Progress Notes (Signed)
Hematology and Oncology Follow Up Visit  Leah Campos [id number] 07-Oct-1970 42 y.o. 03/25/2013 7:42 PM   Principle Diagnosis: Encounter Diagnosis  Name Primary?  . PULMONARY EMBOLISM Yes     Interim History:   Routine visit for this 42 year old woman who sustained  unprovoked bilateral pulmonary emboli at age 1. Hypercoagulation evaluation was unremarkable. I recommended long-term anticoagulation and she remains on Coumadin at this time. Current dose 5 mg daily except 7.5 mg on Thursdays. In August 2013, I saw her son Leah Campos who was  21 at the time and had sustained an unprovoked calf DVT. Although he was found to be a heterozygote for the factor V Leiden gene mutation, in view of his mother's history, I was not convinced that this was his main risk factor and I advised long-term anticoagulation. Mrs. Leah Campos tells me today that she has an appointment for me to see her older son Leah Campos, age 71,  tomorrow. He has now sustained a lower extremity DVT, unprovoked, and was started on Xarelto by his primary care.  Mrs. Leah Campos  remains stable on Coumadin. She denies any dyspnea, chest pain, palpitations, leg swelling or pain.  She is divorced for over 18 years but to her knowledge, her husband has not had any problems with blood clots. He was reluctant to have any laboratory testing until his oldest son developed a clot and now he will be tested.  Medications: reviewed  Allergies:  Allergies  Allergen Reactions  . Codeine Nausea And Vomiting  . Penicillins Hives    Review of Systems: Constitutional:   No constitutional symptoms Respiratory: See above Cardiovascular:  See above Gastrointestinal: No GI symptoms. She had a colonoscopy last year reported to her as normal Genito-Urinary: She has reached menopause. No longer menstruating. Musculoskeletal: No muscle bone or joint pain Neurologic: No headache or change in vision Skin: No rash or ecchymosis Remaining ROS  negative.  Physical Exam: Blood pressure 124/82, pulse 106, temperature 99.6 F (37.6 C), temperature source Oral, resp. rate 18, height 5\' 4"  (1.626 m), weight 147 lb 6.4 oz (66.86 kg), SpO2 98.00%. Wt Readings from Last 3 Encounters:  03/25/13 147 lb 6.4 oz (66.86 kg)  11/29/11 138 lb (62.596 kg)  07/06/09 153 lb (69.4 kg)     General appearance: Well-nourished Caucasian woman  HENNT:  Lymph nodes: No adenopathy Breasts: Lungs: Clear to auscultation resonant to percussion Heart: Regular rhythm no murmur Abdomen: Soft, nontender, no mass, no organomegaly Extremities: No edema, no calf tenderness Musculoskeletal: GU: Vascular: Neurologic: Grossly normal Skin: No rash  Lab Results: Lab Results  Component Value Date   WBC 5.5 11/29/2011   HGB 14.6 11/29/2011   HCT 41.8 11/29/2011   MCV 86.0 11/29/2011   PLT 195 11/29/2011     Chemistry      Component Value Date/Time   NA 135 11/29/2011 1740   K 3.2* 11/29/2011 1740   CL 100 11/29/2011 1740   CO2 24 11/29/2011 1740   BUN 11 11/29/2011 1740   CREATININE 0.63 11/29/2011 1740      Component Value Date/Time   CALCIUM 8.9 11/29/2011 1740   ALKPHOS 56 11/29/2011 1740   AST 22 11/29/2011 1740   ALT 41* 11/29/2011 1740   BILITOT 0.7 11/29/2011 1740      Impression: #1. Idiopathic coagulopathy Although we have not been able to get a precise etiology of her coagulopathy, whatever gene is disturbed has very high penetrance in her family. Now both of her sons have been affected.  I told her if she was interested, I would make a referral to [facility] for a more sophisticated evaluation to look for some more rare etiologies of thrombosis such as PAI-1 inhibitor deficiency.  #2. Status post bilateral unprovoked on her emboli November 2009 She will continue on long-term Coumadin anticoagulation.  We had a lengthy face-to-face discussion about use of the new oral anticoagulants. She was rather upset when her son's  primary care physician put him on Xarelto. I told her that since she had her clot in 2009 we have much more information from mature clinical trials to justify using Xarelto as first-line treatment 4 uncomplicated DVT or PE. We will continue this discussion tomorrow when her oldest son comes in for an evaluation for his new DVT.        Levert Feinstein, MD 6/23/20147:42 PM

## 2013-04-10 ENCOUNTER — Telehealth: Payer: Self-pay | Admitting: *Deleted

## 2013-04-10 NOTE — Telephone Encounter (Signed)
Received call from pt stating that when she last saw Dr Cyndie Chime he discussed a referral to Dr Isaiah Serge at [facility] & is asking if this has been done.  Informed that I would discuss with Dr Cyndie Chime.  She can be reached at [phone] home or [phone] work.

## 2013-04-29 ENCOUNTER — Telehealth: Payer: Self-pay | Admitting: Oncology

## 2013-04-29 NOTE — Telephone Encounter (Signed)
Pt appt  With Dr. Isaiah Serge @ [facility] is 06/18/13@10 :00. Medical records faxed. Pt is aware.

## 2013-08-22 ENCOUNTER — Encounter: Payer: Self-pay | Admitting: Internal Medicine

## 2013-08-22 ENCOUNTER — Ambulatory Visit (INDEPENDENT_AMBULATORY_CARE_PROVIDER_SITE_OTHER): Payer: BC Managed Care – PPO | Admitting: Internal Medicine

## 2013-08-22 ENCOUNTER — Encounter (INDEPENDENT_AMBULATORY_CARE_PROVIDER_SITE_OTHER): Payer: Self-pay

## 2013-08-22 VITALS — BP 110/72 | HR 100 | Temp 98.6°F | Ht 65.0 in | Wt 153.0 lb

## 2013-08-22 DIAGNOSIS — R0602 Shortness of breath: Secondary | ICD-10-CM

## 2013-08-22 NOTE — Patient Instructions (Addendum)
Unclear why you're short of breath despite treatment of PE for several years Need to rule out chronic thromboembolic pulmonary disease Do VQ scan Do echocardiogram for pulmonary hypertension evaluation IF these are normal, will have to workup tachycardia v asthma with metacholine challenge  v CPST bike test Return to see myself or my nurse practitioner after  the above

## 2013-08-22 NOTE — Progress Notes (Signed)
Subjective:    Patient ID: Leah Campos, female    DOB: 1970/10/12, 42 y.o.   MRN: [id number] PCP Londell Moh, MD   HPI  IOV 08/22/2013  Chief Complaint  Patient presents with  . Pulmonary Consult    referred by Dr. Renne Crigler for PE    Bilateral PE on 06/11/2008 (submassive Central PE based on outside CD but [facility]). 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    I last saw this lady in 07/06/2009 and therefore this is a new visit because it has been more than 3 years. At that time patient was placed on Coumadin for 2 years but subsequently extended to 5 years. Since that time she is overall been doing well with good compliance with Coumadin and per her history therapeutic on Coumadin INR. However in the last 1 or 2 years her 51 year old son developed deep vein thrombosis and was diagnosed to have factor V Leyden deficiency. Subsequently her 65 year old son also developed deep vein thrombosis but was negative for factor V Leyden deficiency. Therefore she is being committed to Coumadin for life. The overall health status is stable but she has noticed in the last few years dyspnea on exertion or climbing 1 flight of stairs this is mild. Relieved with exertion.  No associated chest pain, cough, hemoptysis, edema, fever or weight loss. Note: many members of family with asthma   Walking desaturation test on 85 feet and 3 laps on room air: Did not desaturate. Resting heart rate was 103 per minute. Resting pulse ox was 97 per minute. Peak heart rate was 120 and the pulse ox was 96%.  She is concerned about chronic thromboembolic pulmonary disease    Past Medical History  Diagnosis Date  . H/O blood clots   . Acid reflux   . Depression   . Anxiety   . Panic attacks   . Hypercholesteremia      No family history on file.   History    Social History  . Marital Status: Single    Spouse Name: N/A    Number of Children: N/A  . Years of Education: N/A   Occupational History  . Not on file.   Social History Main Topics  . Smoking status: Never Smoker   . Smokeless tobacco: Not on file  . Alcohol Use: Yes     Comment: rarely  . Drug Use: No  . Sexual Activity:    Other Topics Concern  . Not on file   Social History Narrative  . No narrative on file     Allergies  Allergen Reactions  . Codeine Nausea And Vomiting  . Penicillins Hives     Outpatient Prescriptions Prior to Visit  Medication Sig Dispense Refill  . buPROPion (WELLBUTRIN SR) 150 MG 12 hr tablet Take 150 mg by mouth daily.       . Cholecalciferol (VITAMIN D3) 2000 UNITS TABS Take 1 tablet by mouth daily.      . DULoxetine (CYMBALTA) 60 MG capsule Take 60 mg by mouth daily.      . eszopiclone (LUNESTA) 2 MG TABS Take 2 mg by mouth at bedtime as needed. Take immediately before bedtime      . Omeprazole-Sodium Bicarbonate (ZEGERID OTC PO) Take 40 mg by mouth daily.      . rosuvastatin (CRESTOR) 10 MG tablet Take 10 mg by mouth 2 (two) times  a week. Patient takes on Wednesday and Sunday ONLY      . warfarin (COUMADIN) 5 MG tablet Take 2.5-5 mg by mouth daily. Patient takes 5mg  every day except 7.5mg  on Thursday.      Marland Kitchen b complex vitamins capsule Take 1 capsule by mouth daily.       No facility-administered medications prior to visit.      Review of Systems  Constitutional: Negative for fever, chills, diaphoresis, activity change, appetite change, fatigue and unexpected weight change.  HENT: Negative for congestion, dental problem, ear discharge, ear pain, facial swelling, hearing loss, mouth sores, nosebleeds, postnasal drip, rhinorrhea, sinus pressure, sneezing, sore throat, tinnitus, trouble swallowing and voice change.   Eyes: Negative for photophobia, discharge, itching and visual disturbance.  Respiratory: Positive for shortness of  breath. Negative for apnea, cough, choking, chest tightness, wheezing and stridor.   Cardiovascular: Negative for chest pain, palpitations and leg swelling.  Gastrointestinal: Negative for nausea, vomiting, abdominal pain, constipation, blood in stool and abdominal distention.  Genitourinary: Negative for dysuria, urgency, frequency, hematuria, flank pain, decreased urine volume and difficulty urinating.  Musculoskeletal: Negative for arthralgias, back pain, gait problem, joint swelling, myalgias, neck pain and neck stiffness.  Skin: Negative for color change, pallor and rash.  Neurological: Negative for dizziness, tremors, seizures, syncope, speech difficulty, weakness, light-headedness, numbness and headaches.  Hematological: Negative for adenopathy. Does not bruise/bleed easily.  Psychiatric/Behavioral: Negative for confusion, sleep disturbance and agitation. The patient is not nervous/anxious.        Objective:   Physical Exam  Vitals reviewed. Constitutional: She is oriented to person, place, and time. She appears well-developed and well-nourished. No distress.  HENT:  Head: Normocephalic and atraumatic.  Right Ear: External ear normal.  Left Ear: External ear normal.  Mouth/Throat: Oropharynx is clear and moist. No oropharyngeal exudate.  Eyes: Conjunctivae and EOM are normal. Pupils are equal, round, and reactive to light. Right eye exhibits no discharge. Left eye exhibits no discharge. No scleral icterus.  Neck: Normal range of motion. Neck supple. No JVD present. No tracheal deviation present. No thyromegaly present.  Cardiovascular: Normal rate, regular rhythm, normal heart sounds and intact distal pulses.  Exam reveals no gallop and no friction rub.   No murmur heard. Pulmonary/Chest: Effort normal and breath sounds normal. No respiratory distress. She has no wheezes. She has no rales. She exhibits no tenderness.  Abdominal: Soft. Bowel sounds are normal. She exhibits no  distension and no mass. There is no tenderness. There is no rebound and no guarding.  Musculoskeletal: Normal range of motion. She exhibits no edema and no tenderness.  Lymphadenopathy:    She has no cervical adenopathy.  Neurological: She is alert and oriented to person, place, and time. She has normal reflexes. No cranial nerve deficit. She exhibits normal muscle tone. Coordination normal.  Skin: Skin is warm and dry. No rash noted. She is not diaphoretic. No erythema. No pallor.  Psychiatric: She has a normal mood and affect. Her behavior is normal. Judgment and thought content normal.          Assessment & Plan:

## 2013-08-25 NOTE — Assessment & Plan Note (Signed)
Unclear why you're short of breath despite treatment of PE for several years Need to rule out chronic thromboembolic pulmonary disease Do VQ scan  Do echocardiogram for pulmonary hypertension evaluation IF these are normal, will have to workup tachycardia v asthma v CPST bike test Return to see myself or my nurse practitioner after  the above  > 50% of this > 25 min visit spent in face to face counseling (15 min visit converted to 25 min)

## 2013-08-27 ENCOUNTER — Ambulatory Visit (HOSPITAL_COMMUNITY)
Admission: RE | Admit: 2013-08-27 | Discharge: 2013-08-27 | Disposition: A | Discharge status: Home / Self Care | Payer: BC Managed Care – PPO | Source: Ambulatory Visit | Attending: Internal Medicine | Admitting: Internal Medicine

## 2013-08-27 ENCOUNTER — Telehealth: Payer: Self-pay | Admitting: Internal Medicine

## 2013-08-27 DIAGNOSIS — R0602 Shortness of breath: Secondary | ICD-10-CM

## 2013-08-27 DIAGNOSIS — Z86711 Personal history of pulmonary embolism: Secondary | ICD-10-CM | POA: Insufficient documentation

## 2013-08-27 MED ORDER — TECHNETIUM TC 99M DIETHYLENETRIAME-PENTAACETIC ACID: 40.0000 | Freq: Once | INTRAVENOUS | Status: AC | PRN

## 2013-08-27 MED ORDER — TECHNETIUM TO 99M ALBUMIN AGGREGATED
6.0000 | Freq: Once | INTRAVENOUS | Status: AC | PRN
  Administered 2013-08-27: 6 via INTRAVENOUS

## 2013-08-27 NOTE — Telephone Encounter (Signed)
VQ scan is normal and no evidence of even crhonic clot. Very good news. . So cause of dyspnea not known. Please set up for CPST with EIB and followup back with me   Dr. Kalman Shan, M.D., [facility].C.P Pulmonary and Critical Care Medicine Staff Physician [facility] Pager: [phone], If no answer or between  15:00h - 7:00h: call [phone]  08/27/2013 11:02 PM     Dg Chest 2 View  08/27/2013   CLINICAL DATA:  History of pulmonary embolism  EXAM: CHEST  2 VIEW  COMPARISON:  07/18/2008  FINDINGS: The heart size and mediastinal contours are within normal limits. Both lungs are clear. The visualized skeletal structures are unremarkable. Breast implants are noted bilaterally.  IMPRESSION: No active cardiopulmonary disease.   Electronically Signed   By: Alcide Clever M.D.   On: 08/27/2013 12:59   Nm Pulmonary Perf And Vent  08/27/2013   CLINICAL DATA:  History of prior pulmonary embolism, now with shortness of breath, evaluate for recurrent pulmonary embolism  EXAM: NUCLEAR MEDICINE VENTILATION - PERFUSION LUNG SCAN  TECHNIQUE: Ventilation images were obtained in multiple projections using inhaled aerosol technetium 99 M DTPA. Perfusion images were obtained in multiple projections after intravenous injection of Tc-25m MAA.  COMPARISON:  Chest radiograph - 08/27/2013; 07/18/2008; V/Q scan - 07/18/2008; chest CT -06/17/2008  RADIOPHARMACEUTICALS:  40 mCi Tc-18m DTPA aerosol and 6 mCi Tc-77m MAA  FINDINGS: Chest radiograph performed earlier same day demonstrates normal cardiac silhouette and mediastinal contours. No focal parenchymal opacities. No pleural effusion or pneumothorax. No evidence of edema.  Ventilation: There is relative homogeneous distribution of inhaled radiotracer throughout the bilateral pulmonary parenchyma. There is a moderate amount of ingested radiotracer is seen within the superior and mid aspect of the esophagus as well as the  stomach and proximal small bowel.  Perfusion: There is relative homogeneous distribution of injected radiotracer throughout the pulmonary parenchyma. There is resolution of previously noted geographic mismatched filling defect involving the anterior segment of the left upper lobe. No mismatched perfusion abnormalities to suggest pulmonary embolism.  IMPRESSION: 1. Pulmonary embolism absent (very low probability for pulmonary embolism). 2. Interval resolution of previously noted geographic mismatched perfusion defect involving the anterior segment of the left upper lobe demonstrated on remote CT scan performed in 2009.   Electronically Signed   By: Simonne Come M.D.   On: 08/27/2013 13:36

## 2013-08-28 NOTE — Telephone Encounter (Signed)
Pt advised and order placed. Earland Reish, CMA  

## 2013-09-05 ENCOUNTER — Ambulatory Visit (HOSPITAL_COMMUNITY): Payer: BC Managed Care – PPO | Attending: Internal Medicine

## 2013-09-05 DIAGNOSIS — R0602 Shortness of breath: Secondary | ICD-10-CM

## 2013-09-06 ENCOUNTER — Ambulatory Visit (HOSPITAL_COMMUNITY): Payer: BC Managed Care – PPO | Attending: Internal Medicine | Admitting: Cardiology

## 2013-09-06 ENCOUNTER — Other Ambulatory Visit (HOSPITAL_COMMUNITY): Payer: Self-pay | Admitting: Internal Medicine

## 2013-09-06 DIAGNOSIS — R0989 Other specified symptoms and signs involving the circulatory and respiratory systems: Secondary | ICD-10-CM | POA: Insufficient documentation

## 2013-09-06 DIAGNOSIS — E785 Hyperlipidemia, unspecified: Secondary | ICD-10-CM | POA: Insufficient documentation

## 2013-09-06 DIAGNOSIS — R0609 Other forms of dyspnea: Secondary | ICD-10-CM

## 2013-09-06 DIAGNOSIS — R0602 Shortness of breath: Secondary | ICD-10-CM

## 2013-09-06 NOTE — Progress Notes (Signed)
Echo performed. 

## 2013-09-09 ENCOUNTER — Encounter: Payer: Self-pay | Admitting: Internal Medicine

## 2013-09-09 ENCOUNTER — Ambulatory Visit (INDEPENDENT_AMBULATORY_CARE_PROVIDER_SITE_OTHER): Payer: BC Managed Care – PPO | Admitting: Internal Medicine

## 2013-09-09 VITALS — BP 126/82 | HR 90 | Ht 65.0 in | Wt 154.0 lb

## 2013-09-09 DIAGNOSIS — R0602 Shortness of breath: Secondary | ICD-10-CM

## 2013-09-09 NOTE — Patient Instructions (Signed)
VQ scan, echocardiogram, cardiac pulmonary stress test are all normal except for the fact that her pulmonary stress suggest deconditioning Recommend aerobic and weight training exercises under supervision  - But make sure that you do not hurt spine or knees. Preferred exercise or stationary bike, elliptical and swimming Anticoagulation duration to be sorted with Dr. Cyndie Chime No further followup unless dyspnea does not resolve

## 2013-09-09 NOTE — Assessment & Plan Note (Signed)
VQ scan, echocardiogram, cardiac pulmonary stress test are all normal except for the fact that her pulmonary stress suggest deconditioning Recommend aerobic and weight training exercises under supervision  - But make sure that you do not hurt spine or knees. Preferred exercise or stationary bike, elliptical and swimming Anticoagulation duration to be sorted with Dr. Cyndie Chime No further followup unless dyspnea does not resolve  (> 50% of this 15 min visit spent in face to face counseling)

## 2013-09-09 NOTE — Progress Notes (Signed)
   Subjective:    Patient ID: Leah Campos, female    DOB: 1971/03/29, 42 y.o.   MRN: [id number]  HPI  IOV 08/22/2013  Chief Complaint  Patient presents with  . Pulmonary Consult    referred by Dr. Renne Crigler for PE    Bilateral PE on 06/11/2008 (submassive Central PE based on outside CD but [facility]). 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    I last saw this lady in 07/06/2009 and therefore this is a new visit because it has been more than 3 years. At that time patient was placed on Coumadin for 2 years but subsequently extended to 5 years. Since that time she is overall been doing well with good compliance with Coumadin and per her history therapeutic on Coumadin INR. However in the last 1 or 2 years her 37 year old son developed deep vein thrombosis and was diagnosed to have factor V Leyden deficiency. Subsequently her 52 year old son also developed deep vein thrombosis but was negative for factor V Leyden deficiency. Therefore she is being committed to Coumadin for life. The overall health status is stable but she has noticed in the last few years dyspnea on exertion or climbing 1 flight of stairs this is mild. Relieved with exertion.  No associated chest pain, cough, hemoptysis, edema, fever or weight loss. Note: many members of family with asthma   Walking desaturation test on 85 feet and 3 laps on room air: Did not desaturate. Resting heart rate was 103 per minute. Resting pulse ox was 97 per minute. Peak heart rate was 120 and the pulse ox was 96%.  She is concerned about chronic thromboembolic pulmonary disease  OV 09/09/2013  This is a followup for lab results discussion. Concern for chronic thromboembolic pulmonary disease  VQ scan 08/22/2013: Does not show any evidence of pulmonary embolism or chronic thrombolic pulmonary  disease Echocardiogram December 5,2014: Is normal especially right ventricle and pulmonary artery pressure Cardiopulmonary stress test 09/05/2013  - Basically normal test except for mildly reduced VO2 max at 82% predicted. She did reach a heart rate max. Deconditioning suspected etiology for dyspnea       Review of Systems  Constitutional: Negative for fever and unexpected weight change.  HENT: Negative for congestion, dental problem, ear pain, nosebleeds, postnasal drip, rhinorrhea, sinus pressure, sneezing, sore throat and trouble swallowing.   Eyes: Negative for redness and itching.  Respiratory: Negative for cough, chest tightness, shortness of breath and wheezing.   Cardiovascular: Negative for palpitations and leg swelling.  Gastrointestinal: Negative for nausea and vomiting.  Genitourinary: Negative for dysuria.  Musculoskeletal: Negative for joint swelling.  Skin: Negative for rash.  Neurological: Negative for headaches.  Hematological: Does not bruise/bleed easily.  Psychiatric/Behavioral: Negative for dysphoric mood. The patient is not nervous/anxious.        Objective:   Physical Exam  Discussion only visit No exam      Assessment & Plan:

## 2013-09-10 ENCOUNTER — Ambulatory Visit: Payer: BC Managed Care – PPO

## 2013-09-10 ENCOUNTER — Ambulatory Visit (INDEPENDENT_AMBULATORY_CARE_PROVIDER_SITE_OTHER): Payer: BC Managed Care – PPO | Admitting: Family Medicine

## 2013-09-10 VITALS — BP 124/80 | HR 105 | Temp 98.5°F | Resp 17 | Ht 65.0 in | Wt 152.0 lb

## 2013-09-10 DIAGNOSIS — M79609 Pain in unspecified limb: Secondary | ICD-10-CM

## 2013-09-10 DIAGNOSIS — M79645 Pain in left finger(s): Secondary | ICD-10-CM

## 2013-09-10 DIAGNOSIS — S62609A Fracture of unspecified phalanx of unspecified finger, initial encounter for closed fracture: Secondary | ICD-10-CM

## 2013-09-10 MED ORDER — HYDROCODONE-ACETAMINOPHEN 5-325 MG PO TABS: 1.0000 | ORAL_TABLET | Freq: Four times a day (QID) | ORAL | Status: DC | PRN

## 2013-09-10 NOTE — Progress Notes (Addendum)
Subjective:    Patient ID: Leah Campos, female    DOB: 05-16-1971, 42 y.o.   MRN: [id number] This chart was scribed for Ellamae Sia, MD by Clydene Laming, ED Scribe. This patient was seen in room 9 and the patient's care was started at 5:35 PM. HPI HPI Comments: Leah Campos is a 42 y.o. female who presents to the [facility] complaining of a left ring finger injury this morning. Pt was walking her dog when the dog decided to run after something it saw. The lease which was attached to her finger was pulled dramatically.Pt is on coumadin for her hx of bilateral pe.    Patient Active Problem List   Diagnosis Date Noted   SHORTNESS OF BREATH (SOB) 09/08/2008   PULMONARY EMBOLISM 07/18/2008   Past Medical History  Diagnosis Date   H/O blood clots    Acid reflux    Depression    Anxiety    Panic attacks    Hypercholesteremia    Allergy    Clotting disorder    Past Surgical History  Procedure Laterality Date   Abdominal hysterectomy      partial   Tonsillectomy     Breast enhancement surgery     Allergies  Allergen Reactions   Codeine Nausea And Vomiting   Penicillins Hives   Prior to Admission medications   Medication Sig Start Date End Date Taking? Authorizing Provider  buPROPion (WELLBUTRIN SR) 150 MG 12 hr tablet Take 150 mg by mouth daily.    Yes Historical Provider, MD  Cholecalciferol (VITAMIN D3) 2000 UNITS TABS Take 1 tablet by mouth daily.   Yes Historical Provider, MD  DULoxetine (CYMBALTA) 60 MG capsule Take 60 mg by mouth daily.   Yes Historical Provider, MD  eszopiclone (LUNESTA) 2 MG TABS Take 2 mg by mouth at bedtime as needed. Take immediately before bedtime   Yes Historical Provider, MD  Omeprazole-Sodium Bicarbonate (ZEGERID OTC PO) Take 40 mg by mouth daily.   Yes Historical Provider, MD  pregabalin (LYRICA) 100 MG capsule Take 100 mg by mouth daily.   Yes Historical Provider, MD  pyridOXINE (VITAMIN B-6) 100 MG  tablet Take 100 mg by mouth daily.   Yes Historical Provider, MD  rosuvastatin (CRESTOR) 10 MG tablet Take 10 mg by mouth 2 (two) times a week. Patient takes on Wednesday and Sunday ONLY   Yes Historical Provider, MD  vitamin B-12 (CYANOCOBALAMIN) 100 MCG tablet Take 100 mcg by mouth daily.   Yes Historical Provider, MD  warfarin (COUMADIN) 5 MG tablet Take 2.5-5 mg by mouth daily. Patient takes 5mg  every day except 7.5mg  on Thursday.   Yes Historical Provider, MD  HYDROcodone-acetaminophen (NORCO) 5-325 MG per tablet Take 1 tablet by mouth every 6 (six) hours as needed for moderate pain. 09/10/13   Elvina Sidle, MD   History   Social History   Marital Status: Single    Spouse Name: N/A    Number of Children: N/A   Years of Education: N/A   Occupational History   Not on file.   Social History Main Topics   Smoking status: Never Smoker    Smokeless tobacco: Not on file   Alcohol Use: Yes     Comment: rarely   Drug Use: No   Sexual Activity: No   Other Topics Concern   Not on file   Social History Narrative   No narrative on file      Review of Systems  Musculoskeletal: Positive for joint swelling and myalgias.       Objective:   Physical Exam  Musculoskeletal:  Left ring finger swelling and brusing   Filed Vitals:   09/10/13 1658  BP: 124/80  Pulse: 105  Temp: 98.5 F (36.9 C)  TempSrc: Oral  Resp: 17  Height: 5\' 5"  (1.651 m)  Weight: 152 lb (68.947 kg)  SpO2: 98%    [facility] reading (PRIMARY) by  Dr. Milus Glazier:  Comminuted impacted distal phalanx fx of 4th left finger.          Assessment & Plan:  5:38 PM- Discussed treatment plan with pt at bedside. Pt verbalized understanding and agreement with plan.  I personally performed the services described in this documentation, which was scribed in my presence. The recorded information has been reviewed and is accurate.  Finger pain, left - Plan: DG Finger Ring Left, Ambulatory referral to  Orthopedic Surgery, HYDROcodone-acetaminophen (NORCO) 5-325 MG per tablet  Finger fracture, closed, initial encounter - Plan: Ambulatory referral to Orthopedic Surgery, HYDROcodone-acetaminophen (NORCO) 5-325 MG per tablet  Signed, Elvina Sidle, MD

## 2013-09-11 ENCOUNTER — Encounter: Payer: Self-pay | Admitting: *Deleted

## 2013-11-21 ENCOUNTER — Emergency Department (HOSPITAL_COMMUNITY)
Admission: EM | Admit: 2013-11-21 | Discharge: 2013-11-21 | Disposition: A | Discharge status: Home / Self Care | Payer: BC Managed Care – PPO | Attending: Emergency Medicine | Admitting: Emergency Medicine

## 2013-11-21 ENCOUNTER — Emergency Department (HOSPITAL_COMMUNITY): Payer: BC Managed Care – PPO

## 2013-11-21 ENCOUNTER — Encounter (HOSPITAL_COMMUNITY): Payer: Self-pay | Admitting: Emergency Medicine

## 2013-11-21 DIAGNOSIS — R079 Chest pain, unspecified: Secondary | ICD-10-CM | POA: Insufficient documentation

## 2013-11-21 DIAGNOSIS — F3289 Other specified depressive episodes: Secondary | ICD-10-CM | POA: Insufficient documentation

## 2013-11-21 DIAGNOSIS — K219 Gastro-esophageal reflux disease without esophagitis: Secondary | ICD-10-CM | POA: Insufficient documentation

## 2013-11-21 DIAGNOSIS — Z7901 Long term (current) use of anticoagulants: Secondary | ICD-10-CM | POA: Insufficient documentation

## 2013-11-21 DIAGNOSIS — F411 Generalized anxiety disorder: Secondary | ICD-10-CM | POA: Insufficient documentation

## 2013-11-21 DIAGNOSIS — Z79899 Other long term (current) drug therapy: Secondary | ICD-10-CM | POA: Insufficient documentation

## 2013-11-21 DIAGNOSIS — D689 Coagulation defect, unspecified: Secondary | ICD-10-CM | POA: Insufficient documentation

## 2013-11-21 DIAGNOSIS — Z880 Allergy status to penicillin: Secondary | ICD-10-CM | POA: Insufficient documentation

## 2013-11-21 DIAGNOSIS — R6510 Systemic inflammatory response syndrome (SIRS) of non-infectious origin without acute organ dysfunction: Secondary | ICD-10-CM | POA: Insufficient documentation

## 2013-11-21 DIAGNOSIS — E7800 Pure hypercholesterolemia, unspecified: Secondary | ICD-10-CM | POA: Insufficient documentation

## 2013-11-21 DIAGNOSIS — Z86711 Personal history of pulmonary embolism: Secondary | ICD-10-CM | POA: Insufficient documentation

## 2013-11-21 DIAGNOSIS — F329 Major depressive disorder, single episode, unspecified: Secondary | ICD-10-CM | POA: Insufficient documentation

## 2013-11-21 LAB — POCT I-STAT, CHEM 8
BUN: 9 mg/dL (ref 6–23)
CALCIUM ION: 1.09 mmol/L — AB (ref 1.12–1.23)
CHLORIDE: 96 meq/L (ref 96–112)
Creatinine, Ser: 0.7 mg/dL (ref 0.50–1.10)
GLUCOSE: 137 mg/dL — AB (ref 70–99)
HEMATOCRIT: 44 % (ref 36.0–46.0)
Hemoglobin: 15 g/dL (ref 12.0–15.0)
Potassium: 3.3 mEq/L — ABNORMAL LOW (ref 3.7–5.3)
Sodium: 138 mEq/L (ref 137–147)
TCO2: 26 mmol/L (ref 0–100)

## 2013-11-21 LAB — HEPATIC FUNCTION PANEL
ALBUMIN: 4.1 g/dL (ref 3.5–5.2)
ALT: 39 U/L — ABNORMAL HIGH (ref 0–35)
AST: 28 U/L (ref 0–37)
Alkaline Phosphatase: 71 U/L (ref 39–117)
BILIRUBIN TOTAL: 0.4 mg/dL (ref 0.3–1.2)
Bilirubin, Direct: <0.2 mg/dL (ref 0.0–0.3)
TOTAL PROTEIN: 7.3 g/dL (ref 6.0–8.3)

## 2013-11-21 LAB — CBC
HCT: 40.5 % (ref 36.0–46.0)
Hemoglobin: 13.8 g/dL (ref 12.0–15.0)
MCH: 29.9 pg (ref 26.0–34.0)
MCHC: 34.1 g/dL (ref 30.0–36.0)
MCV: 87.9 fL (ref 78.0–100.0)
PLATELETS: 224 10*3/uL (ref 150–400)
RBC: 4.61 MIL/uL (ref 3.87–5.11)
RDW: 13.1 % (ref 11.5–15.5)
WBC: 11.3 10*3/uL — ABNORMAL HIGH (ref 4.0–10.5)

## 2013-11-21 LAB — BASIC METABOLIC PANEL
BUN: 10 mg/dL (ref 6–23)
CO2: 26 meq/L (ref 19–32)
CREATININE: 0.67 mg/dL (ref 0.50–1.10)
Calcium: 8.6 mg/dL (ref 8.4–10.5)
Chloride: 95 mEq/L — ABNORMAL LOW (ref 96–112)
GFR calc Af Amer: >90 mL/min (ref >90)
GFR calc non Af Amer: >90 mL/min (ref >90)
Glucose, Bld: 134 mg/dL — ABNORMAL HIGH (ref 70–99)
POTASSIUM: 3.4 meq/L — AB (ref 3.7–5.3)
Sodium: 137 mEq/L (ref 137–147)

## 2013-11-21 LAB — URINALYSIS, ROUTINE W REFLEX MICROSCOPIC
Bilirubin Urine: NEGATIVE
Glucose, UA: NEGATIVE mg/dL
Ketones, ur: 15 mg/dL — AB
LEUKOCYTES UA: NEGATIVE
NITRITE: NEGATIVE
Protein, ur: NEGATIVE mg/dL
SPECIFIC GRAVITY, URINE: 1.01 (ref 1.005–1.030)
UROBILINOGEN UA: 1 mg/dL (ref 0.0–1.0)
pH: 7 (ref 5.0–8.0)

## 2013-11-21 LAB — POCT I-STAT TROPONIN I: TROPONIN I, POC: 0 ng/mL (ref 0.00–0.08)

## 2013-11-21 LAB — URINE MICROSCOPIC-ADD ON

## 2013-11-21 LAB — LIPASE, BLOOD: Lipase: 18 U/L (ref 11–59)

## 2013-11-21 LAB — PROTIME-INR
INR: 2.83 — ABNORMAL HIGH (ref 0.00–1.49)
PROTHROMBIN TIME: 28.8 s — AB (ref 11.6–15.2)

## 2013-11-21 IMAGING — CR DG CHEST 2V
2 series · 2 of 2 positions shown · non-contrast
Comparison: DG CHEST 2 VIEW dated [DATE]

CLINICAL DATA: Mid chest pain.

EXAM:
CHEST  2 VIEW

[w chest pa]
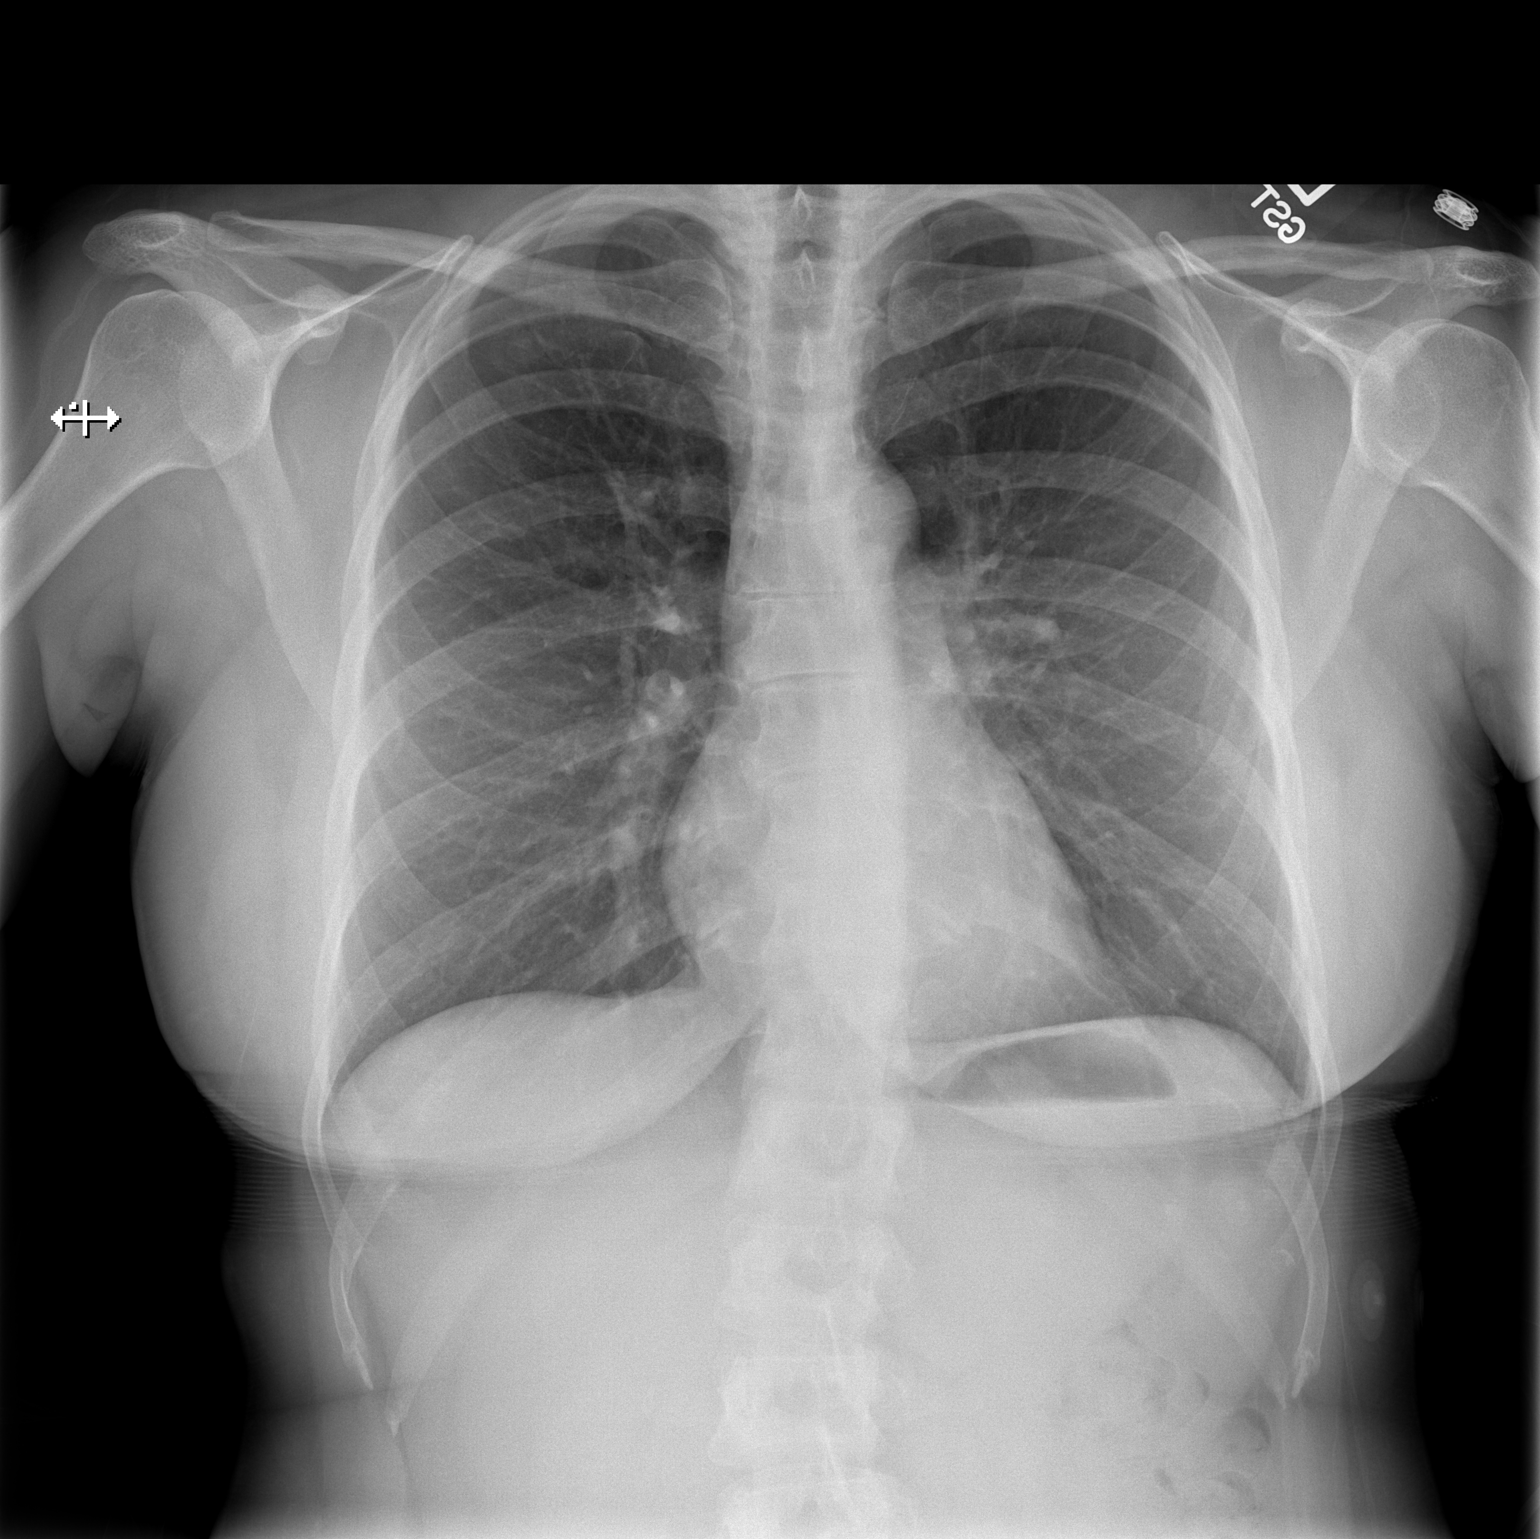

[w chest lat]
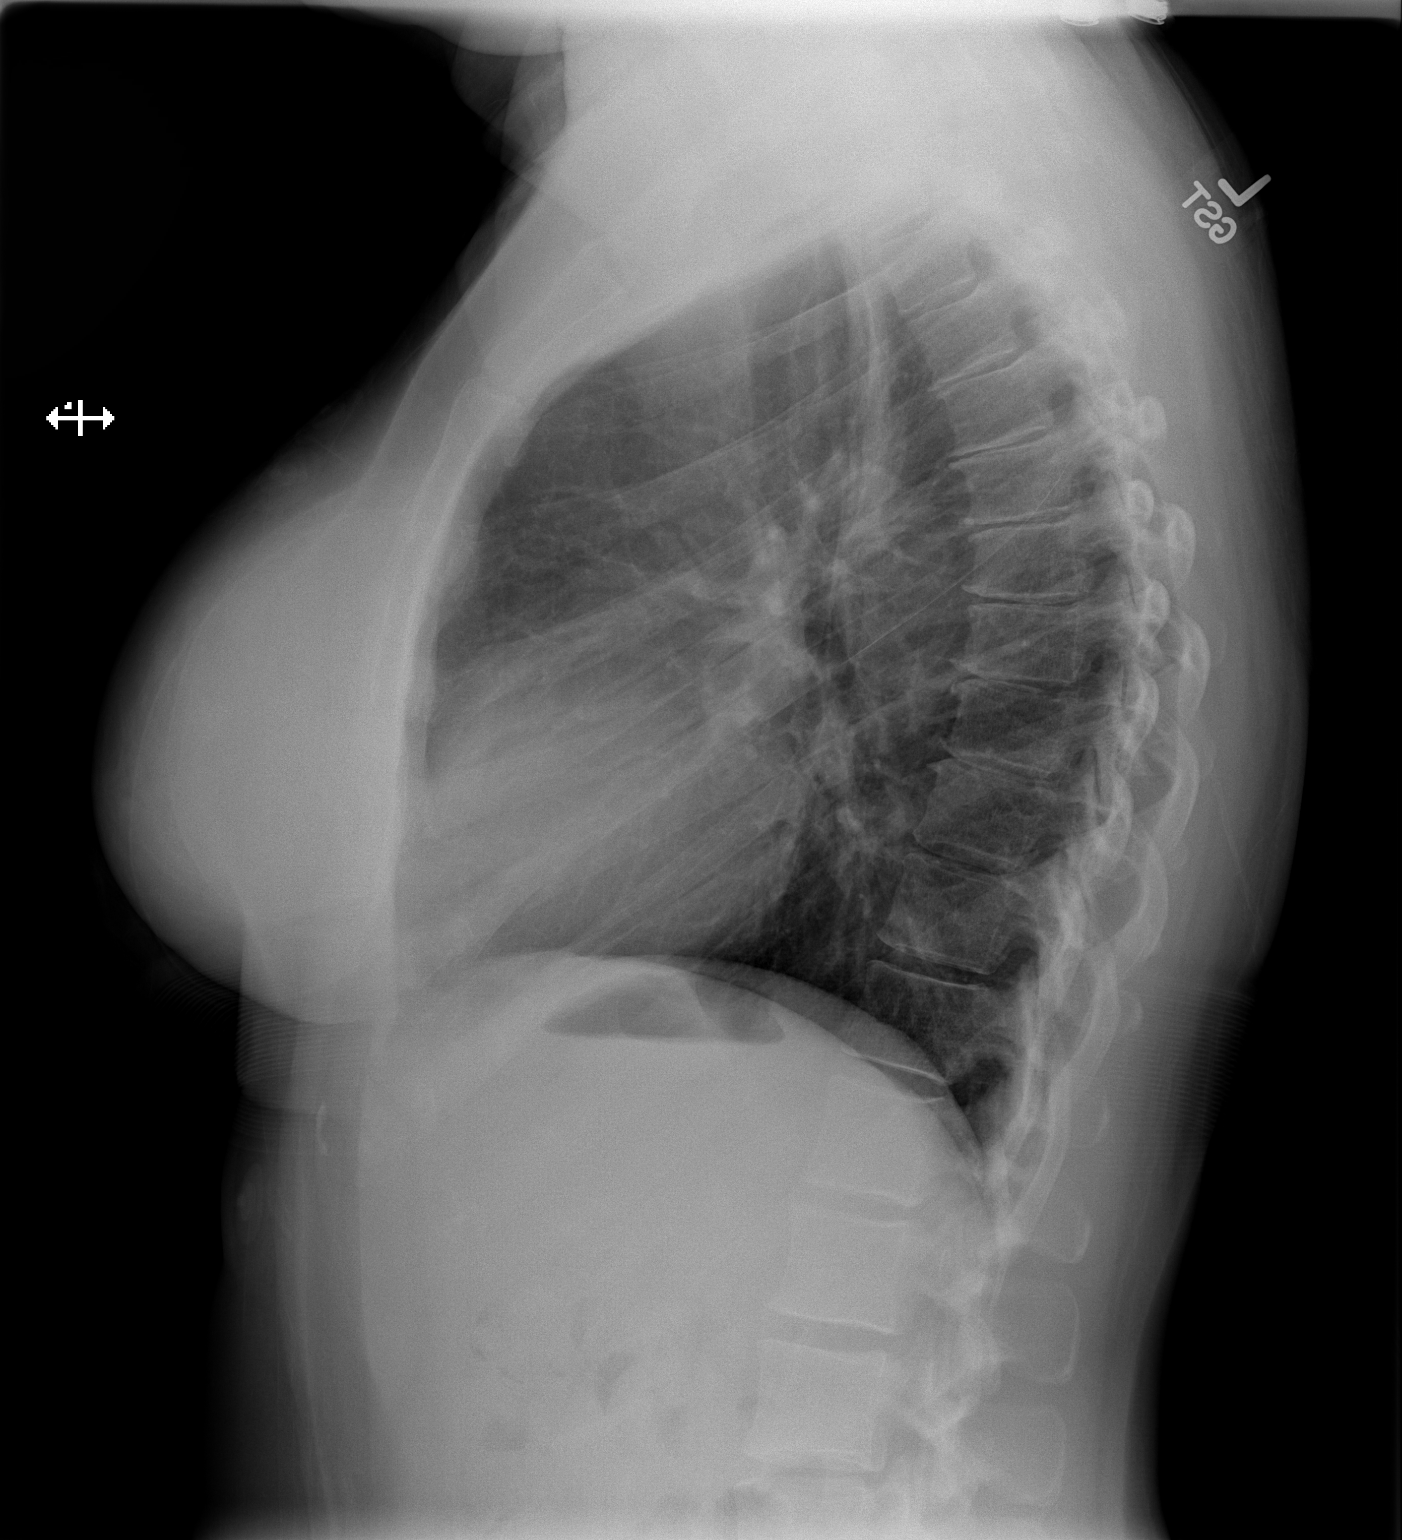

[2 of 2 positions shown; findings below may reference images not displayed]

FINDINGS: Cardiomediastinal silhouette is unremarkable. The lungs are clear
without pleural effusions or focal consolidations. Trachea projects
midline and there is no pneumothorax. Soft tissue planes and
included osseous structures are non-suspicious. Bilateral breast
implants. Mild lower thoracic levoscoliosis. Mild degenerative
change of the thoracic spine.
IMPRESSION: No active cardiopulmonary process.

  By: LOCKLEAR

## 2013-11-21 MED ORDER — ONDANSETRON HCL 4 MG/2ML IJ SOLN
4.0000 mg | Freq: Once | INTRAMUSCULAR | Status: AC
  Administered 2013-11-21: 4 mg via INTRAVENOUS
  Filled 2013-11-21: qty 2

## 2013-11-21 MED ORDER — IOHEXOL 350 MG/ML SOLN
100.0000 mL | Freq: Once | INTRAVENOUS | Status: AC | PRN
  Administered 2013-11-21: 100 mL via INTRAVENOUS

## 2013-11-21 MED ORDER — SODIUM CHLORIDE 0.9 % IV BOLUS (SEPSIS)
1000.0000 mL | Freq: Once | INTRAVENOUS | Status: AC
  Administered 2013-11-21: 1000 mL via INTRAVENOUS

## 2013-11-21 MED ORDER — GI COCKTAIL ~~LOC~~
30.0000 mL | Freq: Once | ORAL | Status: AC
  Administered 2013-11-21: 30 mL via ORAL
  Filled 2013-11-21: qty 30

## 2013-11-21 MED ORDER — MORPHINE SULFATE 4 MG/ML IJ SOLN
4.0000 mg | Freq: Once | INTRAMUSCULAR | Status: AC
  Administered 2013-11-21: 4 mg via INTRAVENOUS
  Filled 2013-11-21: qty 1

## 2013-11-21 MED ORDER — FAMOTIDINE IN NACL 20-0.9 MG/50ML-% IV SOLN
20.0000 mg | Freq: Once | INTRAVENOUS | Status: AC
  Administered 2013-11-21: 20 mg via INTRAVENOUS
  Filled 2013-11-21: qty 50

## 2013-11-21 NOTE — Discharge Instructions (Signed)
Chest Pain (Nonspecific) °Chest pain has many causes. Your pain could be caused by something serious, such as a heart attack or a blood clot in the lungs. It could also be caused by something less serious, such as a chest bruise or a virus. Follow up with your doctor. More lab tests or other studies may be needed to find the cause of your pain. Most of the time, nonspecific chest pain will improve within 2 to 3 days of rest and mild pain medicine. °HOME CARE °· For chest bruises, you may put ice on the sore area for 15-20 minutes, 03-04 times a day. Do this only if it makes you feel better. °· Put ice in a plastic bag. °· Place a towel between the skin and the bag. °· Rest for the next 2 to 3 days. °· Go back to work if the pain improves. °· See your doctor if the pain lasts longer than 1 to 2 weeks. °· Only take medicine as told by your doctor. °· Quit smoking if you smoke. °GET HELP RIGHT AWAY IF:  °· There is more pain or pain that spreads to the arm, neck, jaw, back, or belly (abdomen). °· You have shortness of breath. °· You cough more than usual or cough up blood. °· You have very bad back or belly pain, feel sick to your stomach (nauseous), or throw up (vomit). °· You have very bad weakness. °· You pass out (faint). °· You have a fever. °Any of these problems may be serious and may be an emergency. Do not wait to see if the problems will go away. Get medical help right away. Call your local emergency services 911 in [place] Do not drive yourself to the hospital. °MAKE SURE YOU:  °· Understand these instructions. °· Will watch this condition. °· Will get help right away if you or your child is not doing well or gets worse. °Document Released: 03/07/2008 Document Revised: 12/12/2011 Document Reviewed: 03/07/2008 °ExitCare® Patient Information ©2014 [employer]. ° °

## 2013-11-21 NOTE — ED Notes (Signed)
Urine collection was a clean catch not catheterization.

## 2013-11-21 NOTE — ED Notes (Signed)
Patient presents with c/o chest tightness that started last night.

## 2013-11-21 NOTE — ED Notes (Signed)
Manly, MD at bedside.  

## 2013-11-21 NOTE — ED Provider Notes (Signed)
CSN: [id number]     Arrival date & time 11/21/13  0436 History   First MD Initiated Contact with Patient 11/21/13 0458     Chief Complaint  Patient presents with  . 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 She presents with centrally located chest pain which is pressure like. Sx began about 6 hrs ago but, have worsened. The patient denies SOB. Her discomfort is 7/10. Pain radiates intermittently to the right chest and arm.   The pain is different than pain from PE which was classic and pleuritic, per conversation with patient. She has not had cough, fever, abdominal pain, SOB. No nausea, vomiting or diarrhea. The patient notes that she just had a negative treadmill stress test approx 1 month ago. She has no h/o CAD and her only RF for CAD is HTN.   Past Medical History  Diagnosis Date  . H/O blood clots   . Acid reflux   . Depression   . Anxiety   . Panic attacks   . Hypercholesteremia   . Allergy   . Clotting disorder    Past Surgical History  Procedure Laterality Date  . Abdominal hysterectomy      partial  . Tonsillectomy    . Breast enhancement surgery     History reviewed. No pertinent family history. History  Substance Use Topics  . Smoking status: Never Smoker   . Smokeless tobacco: Not on file  . Alcohol Use: Yes     Comment: rarely   OB History   Grav Para Term Preterm Abortions TAB SAB Ect Mult Living                 Review of Systems Ten point review of symptoms performed and is negative with the exception of symptoms noted above.    Allergies  Codeine and Penicillins  Home Medications   Current Outpatient Rx  Name  Route  Sig  Dispense  Refill  . buPROPion (WELLBUTRIN SR) 150 MG 12 hr tablet   Oral   Take 150 mg by mouth daily.          . Cholecalciferol (VITAMIN D3) 2000 UNITS TABS  Oral   Take 1 tablet by mouth daily.         . DULoxetine (CYMBALTA) 60 MG capsule   Oral   Take 60 mg by mouth daily.         . eszopiclone (LUNESTA) 2 MG TABS   Oral   Take 2 mg by mouth at bedtime as needed. Take immediately before bedtime         . HYDROcodone-acetaminophen (NORCO) 5-325 MG per tablet   Oral   Take 1 tablet by mouth every 6 (six) hours as needed for moderate pain.   30 tablet   0   . Omeprazole-Sodium Bicarbonate (ZEGERID OTC PO)   Oral   Take 40 mg by mouth daily.         . pregabalin (LYRICA) 100 MG capsule   Oral   Take 100 mg by mouth daily.         Marland Kitchen pyridOXINE (VITAMIN B-6) 100 MG tablet   Oral   Take 100 mg by mouth daily.         . rosuvastatin (CRESTOR) 10 MG tablet   Oral   Take 10 mg by mouth 2 (two) times a week. Patient takes  on Wednesday and Sunday ONLY         . vitamin B-12 (CYANOCOBALAMIN) 100 MCG tablet   Oral   Take 100 mcg by mouth daily.         Marland Kitchen. warfarin (COUMADIN) 5 MG tablet   Oral   Take 2.5-5 mg by mouth daily. Patient takes 5mg  every day except 7.5mg  on Thursday.          BP 128/87  Pulse 123  Temp(Src) 98.4 F (36.9 C) (Oral)  Resp 20  Ht 5\' 5"  (1.651 m)  Wt 148 lb (67.132 kg)  BMI 24.63 kg/m2  SpO2 100% Physical Exam Gen: well developed and well nourished appearing Head: NCAT Eyes: PERL, EOMI Nose: no epistaixis or rhinorrhea Mouth/throat: mucosa is moist and pink Neck: supple, no stridor Lungs: RR 24/min, CTA B, no wheezing, rhonchi or rales CV: rapid and regular, pulse 116,  no murmur, extremities appear well perfused.  Abd: soft, notender, nondistended Back: no ttp, no cva ttp Skin: warm and dry Ext: normal to inspection, no dependent edema Neuro: CN ii-xii grossly intact, no focal deficits Psyche; normal affect,  calm and cooperative.   ED Course  Procedures (including critical care time) Labs Review  Results for orders placed during the hospital encounter of 11/21/13 (from  the past 24 hour(s))  POCT I-STAT TROPONIN I     Status: None   Collection Time    11/21/13  5:03 AM      Result Value Ref Range   Troponin i, poc 0.00  0.00 - 0.08 ng/mL   Comment 3           POCT I-STAT, CHEM 8     Status: Abnormal   Collection Time    11/21/13  5:05 AM      Result Value Ref Range   Sodium 138  137 - 147 mEq/L   Potassium 3.3 (*) 3.7 - 5.3 mEq/L   Chloride 96  96 - 112 mEq/L   BUN 9  6 - 23 mg/dL   Creatinine, Ser 8.110.70  0.50 - 1.10 mg/dL   Glucose, Bld 914137 (*) 70 - 99 mg/dL   Calcium, Ion 7.821.09 (*) 1.12 - 1.23 mmol/L   TCO2 26  0 - 100 mmol/L   Hemoglobin 15.0  12.0 - 15.0 g/dL   HCT 95.644.0  21.336.0 - 08.646.0 %  CBC     Status: Abnormal   Collection Time    11/21/13  5:13 AM      Result Value Ref Range   WBC 11.3 (*) 4.0 - 10.5 K/uL   RBC 4.61  3.87 - 5.11 MIL/uL   Hemoglobin 13.8  12.0 - 15.0 g/dL   HCT 57.840.5  46.936.0 - 62.946.0 %   MCV 87.9  78.0 - 100.0 fL   MCH 29.9  26.0 - 34.0 pg   MCHC 34.1  30.0 - 36.0 g/dL   RDW 52.813.1  41.311.5 - 24.415.5 %   Platelets 224  150 - 400 K/uL  BASIC METABOLIC PANEL     Status: Abnormal   Collection Time    11/21/13  5:13 AM      Result Value Ref Range   Sodium 137  137 - 147 mEq/L   Potassium 3.4 (*) 3.7 - 5.3 mEq/L   Chloride 95 (*) 96 - 112 mEq/L   CO2 26  19 - 32 mEq/L   Glucose, Bld 134 (*) 70 - 99 mg/dL   BUN 10  6 - 23 mg/dL  Creatinine, Ser 0.67  0.50 - 1.10 mg/dL   Calcium 8.6  8.4 - 16.1 mg/dL   GFR calc non Af Amer >90  >90 mL/min   GFR calc Af Amer >90  >90 mL/min  HEPATIC FUNCTION PANEL     Status: Abnormal   Collection Time    11/21/13  5:13 AM      Result Value Ref Range   Total Protein 7.3  6.0 - 8.3 g/dL   Albumin 4.1  3.5 - 5.2 g/dL   AST 28  0 - 37 U/L   ALT 39 (*) 0 - 35 U/L   Alkaline Phosphatase 71  39 - 117 U/L   Total Bilirubin 0.4  0.3 - 1.2 mg/dL   Bilirubin, Direct <0.9  0.0 - 0.3 mg/dL   Indirect Bilirubin NOT CALCULATED  0.3 - 0.9 mg/dL  LIPASE, BLOOD     Status: None   Collection Time     11/21/13  5:13 AM      Result Value Ref Range   Lipase 18  11 - 59 U/L   DG Chest 2 View (Final result)  Result time: 11/21/13 06:14:40    Final result by Rad Results In Interface (11/21/13 06:14:40)    Narrative:   CLINICAL DATA: Mid chest pain.  EXAM: CHEST 2 VIEW  COMPARISON: DG CHEST 2 VIEW dated 08/27/2013  FINDINGS: Cardiomediastinal silhouette is unremarkable. The lungs are clear without pleural effusions or focal consolidations. Trachea projects midline and there is no pneumothorax. Soft tissue planes and included osseous structures are non-suspicious. Bilateral breast implants. Mild lower thoracic levoscoliosis. Mild degenerative change of the thoracic spine.  IMPRESSION: No active cardiopulmonary process.   Electronically Signed By: Awilda Metro On: 11/21/2013 06:14          EKG: sinus tach, 123 bpm, normal axis, normal QRS, non specific T wave abnormalities, normal intervals  MDM   DDX: pneumonia, PE, pleural effusion, pericarditis, pancreatitis, GERD  6045: Patient says she is feeling better but still has some residual burning in her chest. Her pain is worse when laying down. Pain is 3/10 at this time. CTA chest negative for PE, infiltrate, effusion or any other acute findings. Mild improvement in tachycardia from 120s to 112-116 after 1L NS.  Abdominal exam remains benign. Labs notable for therapeutic INR and mild leukocytosis. Lipase, LFTs wnl.   We send TSH to evaluate for hyperthyroidism, treat with GI cocktail and 2nd liter of NS.   0803: Patient is feeling better after GI cocktail. Tachycardia improved. Patient stable for discharge. She will follow up with her PCP for 24 hr recheck and is counseled re: return precautions.   Brandt Loosen, MD 11/21/13 [phone]

## 2013-11-30 ENCOUNTER — Encounter: Payer: Self-pay | Admitting: Oncology

## 2014-03-24 ENCOUNTER — Ambulatory Visit: Payer: BC Managed Care – PPO | Admitting: Oncology

## 2014-07-31 ENCOUNTER — Other Ambulatory Visit: Payer: Self-pay | Admitting: Obstetrics and Gynecology

## 2014-08-01 LAB — CYTOLOGY - PAP

## 2017-03-06 DIAGNOSIS — Z7901 Long term (current) use of anticoagulants: Secondary | ICD-10-CM | POA: Diagnosis not present

## 2017-03-06 DIAGNOSIS — Z86711 Personal history of pulmonary embolism: Secondary | ICD-10-CM | POA: Diagnosis not present

## 2017-04-28 ENCOUNTER — Ambulatory Visit (INDEPENDENT_AMBULATORY_CARE_PROVIDER_SITE_OTHER): Payer: 59

## 2017-04-28 ENCOUNTER — Ambulatory Visit (INDEPENDENT_AMBULATORY_CARE_PROVIDER_SITE_OTHER): Payer: 59 | Admitting: Physician Assistant

## 2017-04-28 ENCOUNTER — Encounter: Payer: Self-pay | Admitting: Physician Assistant

## 2017-04-28 VITALS — BP 137/92 | HR 97 | Resp 16 | Ht 65.0 in | Wt 154.8 lb

## 2017-04-28 DIAGNOSIS — K5900 Constipation, unspecified: Secondary | ICD-10-CM | POA: Diagnosis not present

## 2017-04-28 DIAGNOSIS — R1084 Generalized abdominal pain: Secondary | ICD-10-CM

## 2017-04-28 DIAGNOSIS — R194 Change in bowel habit: Secondary | ICD-10-CM | POA: Diagnosis not present

## 2017-04-28 LAB — POCT URINALYSIS DIP (MANUAL ENTRY)
BILIRUBIN UA: NEGATIVE
BILIRUBIN UA: NEGATIVE mg/dL
Glucose, UA: NEGATIVE mg/dL
LEUKOCYTES UA: NEGATIVE
Nitrite, UA: NEGATIVE
Protein Ur, POC: NEGATIVE mg/dL
Spec Grav, UA: 1.02 (ref 1.010–1.025)
Urobilinogen, UA: 1 E.U./dL
pH, UA: 7 (ref 5.0–8.0)

## 2017-04-28 LAB — POCT CBC
GRANULOCYTE PERCENT: 66.8 % (ref 37–80)
HCT, POC: 45.8 % (ref 37.7–47.9)
HEMOGLOBIN: 15.5 g/dL (ref 12.2–16.2)
Lymph, poc: 1.6 (ref 0.6–3.4)
MCH, POC: 28.8 pg (ref 27–31.2)
MCHC: 34 g/dL (ref 31.8–35.4)
MCV: 84.8 fL (ref 80–97)
MID (cbc): 0.5 (ref 0–0.9)
MPV: 8.1 fL (ref 0–99.8)
POC Granulocyte: 4.1 (ref 2–6.9)
POC LYMPH %: 25.7 % (ref 10–50)
POC MID %: 7.5 %M (ref 0–12)
Platelet Count, POC: 287 10*3/uL (ref 142–424)
RBC: 5.4 M/uL (ref 4.04–5.48)
RDW, POC: 14 %
WBC: 6.2 10*3/uL (ref 4.6–10.2)

## 2017-04-28 LAB — POC HEMOCCULT BLD/STL (OFFICE/1-CARD/DIAGNOSTIC): FECAL OCCULT BLD: NEGATIVE

## 2017-04-28 MED ORDER — LACTULOSE 10 GM/15ML PO SOLN: 20.0000 g | Freq: Two times a day (BID) | ORAL | 0 refills | Status: AC

## 2017-04-28 MED ORDER — METOCLOPRAMIDE HCL 5 MG PO TABS: 5.0000 mg | ORAL_TABLET | Freq: Three times a day (TID) | ORAL | 0 refills | Status: AC

## 2017-04-28 NOTE — Progress Notes (Addendum)
04/28/2017 12:15 PM   DOB: [birth year] / MRN: [id number]  SUBJECTIVE:  Leah Campos is a 46 y.o. female presenting for a change in bowel habits.  Tells me that she has not had a good BM in about 2 weeks.  Has tried ducusate which did cause some watery diarrhea.  Did try Miralax but the did not help at all. Has increased her water intake. Tells me that she can not pass gas.  Has cramping, nausea, and eating makes the symptoms worse. Takes coumadin, cymbalta, zyrtec, vitamin d, b12, ambien, all chornically and denies any new medications. Also takes pantoprazole but has held this in the last 3 days. Tells me her last INR was 3.3. Denies any major diet changes in the last month. Has a history of tubal ligation and a partial hysterectomy. She does not complain of abnormal vaginal discharge.   She is allergic to codeine and penicillins.   She  has a past medical history of Acid reflux; Allergy; Anxiety; Clotting disorder ([place]); Depression; H/O blood clots; Hypercholesteremia; and Panic attacks.    She  reports that she has never smoked. She has never used smokeless tobacco. She reports that she drinks alcohol. She reports that she does not use drugs. She  reports that she does not engage in sexual activity. The patient  has a past surgical history that includes Abdominal hysterectomy; Tonsillectomy; and Breast enhancement surgery.  Her family history is not on file.  Review of Systems  Constitutional: Negative for chills and fever.  Respiratory: Negative for cough and shortness of breath.   Cardiovascular: Negative for chest pain and leg swelling.  Gastrointestinal: Positive for abdominal pain and constipation. Negative for blood in stool, heartburn, melena, nausea and vomiting.  Skin: Negative for itching and rash.  Neurological: Negative for dizziness.  Endo/Heme/Allergies: Negative for polydipsia.    The problem list and medications were reviewed and updated by myself where necessary and  exist elsewhere in the encounter.   OBJECTIVE:  BP (!) 137/92 (BP Location: Right Arm, Patient Position: Sitting, Cuff Size: Normal)   Pulse 97   Resp 16   Ht '5\' 5"'  (1.651 m)   Wt 154 lb 12.8 oz (70.2 kg)   SpO2 99%   BMI 25.76 kg/m   Physical Exam  Constitutional: She is oriented to person, place, and time. She appears well-developed and well-nourished. She is active. No distress.  Cardiovascular: Normal rate, regular rhythm, normal heart sounds and intact distal pulses.   Pulmonary/Chest: Effort normal. No tachypnea.  Abdominal: Soft. Bowel sounds are normal. She exhibits no distension and no mass. There is tenderness (about the transverse colon). There is no rebound and no guarding.  Genitourinary: Rectal exam shows guaiac negative stool.  Neurological: She is alert and oriented to person, place, and time.  Skin: Skin is warm and dry. She is not diaphoretic.    Lab Results  Component Value Date   CHOL  06/12/2008    182        ATP III CLASSIFICATION:  <200     mg/dL   Desirable  200-239  mg/dL   Borderline High  >=240    mg/dL   High   HDL 34 (L) 06/12/2008   LDLCALC (H) 06/12/2008    124        Total Cholesterol/HDL:CHD Risk Coronary Heart Disease Risk Table                     Men  Women  1/2 Average Risk   3.4   3.3   TRIG 121 06/12/2008   CHOLHDL 5.4 06/12/2008     Results for orders placed or performed in visit on 04/28/17 (from the past 72 hour(s))  POCT urinalysis dipstick     Status: Abnormal   Collection Time: 04/28/17 11:23 AM  Result Value Ref Range   Color, UA yellow yellow   Clarity, UA clear clear   Glucose, UA negative negative mg/dL   Bilirubin, UA negative negative   Ketones, POC UA negative negative mg/dL   Spec Grav, UA 1.020 1.010 - 1.025   Blood, UA trace-intact (A) negative   pH, UA 7.0 5.0 - 8.0   Protein Ur, POC negative negative mg/dL   Urobilinogen, UA 1.0 0.2 or 1.0 E.U./dL   Nitrite, UA Negative Negative   Leukocytes, UA  Negative Negative  POCT CBC     Status: None   Collection Time: 04/28/17 11:48 AM  Result Value Ref Range   WBC 6.2 4.6 - 10.2 K/uL   Lymph, poc 1.6 0.6 - 3.4   POC LYMPH PERCENT 25.7 10 - 50 %L   MID (cbc) 0.5 0 - 0.9   POC MID % 7.5 0 - 12 %M   POC Granulocyte 4.1 2 - 6.9   Granulocyte percent 66.8 37 - 80 %G   RBC 5.40 4.04 - 5.48 M/uL   Hemoglobin 15.5 12.2 - 16.2 g/dL   HCT, POC 45.8 37.7 - 47.9 %   MCV 84.8 80 - 97 fL   MCH, POC 28.8 27 - 31.2 pg   MCHC 34.0 31.8 - 35.4 g/dL   RDW, POC 14.0 %   Platelet Count, POC 287 142 - 424 K/uL   MPV 8.1 0 - 99.8 fL    Dg Abd 2 Views  Result Date: 04/28/2017 CLINICAL DATA:  Constipation for 2 weeks EXAM: ABDOMEN - 2 VIEW COMPARISON:  None. FINDINGS: There is no bowel dilatation to suggest obstruction. There is no evidence of pneumoperitoneum, portal venous gas or pneumatosis. There are no pathologic calcifications along the expected course of the ureters. There is a dextrocurvature of the thoracolumbar spine. IMPRESSION: Negative. Electronically Signed   By: Kathreen Devoid   On: 04/28/2017 11:18    ASSESSMENT AND PLAN:  Leah Campos was seen today for abdominal pain and constipation.  Diagnoses and all orders for this visit:  Generalized abdominal pain: -     Cancel: CBC -     POCT urinalysis dipstick -     DG Abd 2 Views; Future -     POCT CBC -     CMP14+EGFR -     lactulose (CHRONULAC) 10 GM/15ML solution; Take 30 mLs (20 g total) by mouth 2 (two) times daily. -     metoCLOPramide (REGLAN) 5 MG tablet; Take 1 tablet (5 mg total) by mouth 3 (three) times daily before meals. -     Hemoccult - 1 Card (office)  Change in bowel habits: She tells me that it is not abnormal for her to go 4-5 days without a bowel movement. Above work up negative. No blood in the stool.  She has some mild tenderness about the transverse colon however not exquisite and she is well appearing.  Will try reglan and lactulose and see her back on Monday. If she still  is not moving her bowels at that time will perform a pelvic and plan to CT the abdomen to get more information.  The patient is advised to call or return to clinic if she does not see an improvement in symptoms, or to seek the care of the closest emergency department if she worsens with the above plan.   Philis Fendt, MHS, PA-C [facility] 04/28/2017 12:15 PM   Addendum 05/01/2017 3:58 PM: Patient calling reporting no real improvement in her symptoms.  Will go ahead a CT the abdomen.Referrals aware and they are working on this now.  Philis Fendt, MS, PA-C 3:58 PM, 05/01/2017

## 2017-04-28 NOTE — Patient Instructions (Signed)
     IF you received an x-ray today, you will receive an invoice from [facility]. Please contact [facility] at [phone] with questions or concerns regarding your invoice.   IF you received labwork today, you will receive an invoice from [facility]. Please contact [facility] at [phone] with questions or concerns regarding your invoice.   Our billing staff will not be able to assist you with questions regarding bills from these companies.  You will be contacted with the lab results as soon as they are available. The fastest way to get your results is to activate your My Chart account. Instructions are located on the last page of this paperwork. If you have not heard from us regarding the results in 2 weeks, please contact this office.     

## 2017-04-29 LAB — CMP14+EGFR
ALT: 13 IU/L (ref 0–32)
AST: 12 IU/L (ref 0–40)
Albumin/Globulin Ratio: 1.8 (ref 1.2–2.2)
Albumin: 4.9 g/dL (ref 3.5–5.5)
Alkaline Phosphatase: 77 IU/L (ref 39–117)
BILIRUBIN TOTAL: 0.5 mg/dL (ref 0.0–1.2)
BUN/Creatinine Ratio: 16 (ref 9–23)
BUN: 12 mg/dL (ref 6–24)
CHLORIDE: 101 mmol/L (ref 96–106)
CO2: 25 mmol/L (ref 20–29)
Calcium: 9.3 mg/dL (ref 8.7–10.2)
Creatinine, Ser: 0.74 mg/dL (ref 0.57–1.00)
GFR calc Af Amer: 112 mL/min/{1.73_m2} (ref >59)
GFR calc non Af Amer: 97 mL/min/{1.73_m2} (ref >59)
GLUCOSE: 86 mg/dL (ref 65–99)
Globulin, Total: 2.7 g/dL (ref 1.5–4.5)
Potassium: 4.6 mmol/L (ref 3.5–5.2)
Sodium: 142 mmol/L (ref 134–144)
TOTAL PROTEIN: 7.6 g/dL (ref 6.0–8.5)

## 2017-05-01 ENCOUNTER — Telehealth: Payer: Self-pay | Admitting: Physician Assistant

## 2017-05-01 NOTE — Telephone Encounter (Signed)
Pt is wanting to let Leah Campos know that the meds given on firday has helped a little but nothing since Friday and she would like him to go ahead with referral   Best number [phone]

## 2017-05-01 NOTE — Addendum Note (Signed)
Addended by: Ofilia NeasLARK, Merinda Victorino L on: 05/01/2017 04:01 PM   Modules accepted: Orders

## 2017-05-01 NOTE — Telephone Encounter (Signed)
Please place referral if appropriate.

## 2017-05-02 ENCOUNTER — Other Ambulatory Visit: Payer: Self-pay

## 2017-05-02 ENCOUNTER — Ambulatory Visit
Admission: RE | Admit: 2017-05-02 | Discharge: 2017-05-02 | Disposition: A | Discharge status: Home / Self Care | Payer: 59 | Source: Ambulatory Visit | Attending: Physician Assistant | Admitting: Physician Assistant

## 2017-05-02 DIAGNOSIS — R194 Change in bowel habit: Secondary | ICD-10-CM

## 2017-05-02 DIAGNOSIS — R1084 Generalized abdominal pain: Secondary | ICD-10-CM

## 2017-05-02 DIAGNOSIS — R1937 Generalized abdominal rigidity: Secondary | ICD-10-CM | POA: Diagnosis not present

## 2017-05-02 MED ORDER — IOPAMIDOL (ISOVUE-300) INJECTION 61%
100.0000 mL | Freq: Once | INTRAVENOUS | Status: AC | PRN
  Administered 2017-05-02: 100 mL via INTRAVENOUS

## 2017-05-02 NOTE — Telephone Encounter (Signed)
We are planning to scan her abdomen today.  Please check with Leah Campos or pt to ensure she knows when and where to be. Deliah BostonMichael Kaedan Richert, MS, PA-C 9:28 AM, 05/02/2017

## 2017-05-16 DIAGNOSIS — R5383 Other fatigue: Secondary | ICD-10-CM | POA: Diagnosis not present

## 2017-05-16 DIAGNOSIS — K5900 Constipation, unspecified: Secondary | ICD-10-CM | POA: Diagnosis not present

## 2017-05-17 DIAGNOSIS — Z86711 Personal history of pulmonary embolism: Secondary | ICD-10-CM | POA: Diagnosis not present

## 2017-05-17 DIAGNOSIS — K5900 Constipation, unspecified: Secondary | ICD-10-CM | POA: Diagnosis not present

## 2017-05-17 DIAGNOSIS — R194 Change in bowel habit: Secondary | ICD-10-CM | POA: Diagnosis not present

## 2017-05-30 DIAGNOSIS — Z86711 Personal history of pulmonary embolism: Secondary | ICD-10-CM | POA: Diagnosis not present

## 2017-05-30 DIAGNOSIS — Z7901 Long term (current) use of anticoagulants: Secondary | ICD-10-CM | POA: Diagnosis not present

## 2017-06-06 DIAGNOSIS — R194 Change in bowel habit: Secondary | ICD-10-CM | POA: Diagnosis not present

## 2017-06-19 DIAGNOSIS — I2699 Other pulmonary embolism without acute cor pulmonale: Secondary | ICD-10-CM | POA: Diagnosis not present

## 2017-06-19 DIAGNOSIS — Z7901 Long term (current) use of anticoagulants: Secondary | ICD-10-CM | POA: Diagnosis not present

## 2017-07-17 DIAGNOSIS — Z1211 Encounter for screening for malignant neoplasm of colon: Secondary | ICD-10-CM | POA: Diagnosis not present

## 2017-10-26 DIAGNOSIS — Z0000 Encounter for general adult medical examination without abnormal findings: Secondary | ICD-10-CM | POA: Diagnosis not present

## 2017-10-26 DIAGNOSIS — E559 Vitamin D deficiency, unspecified: Secondary | ICD-10-CM | POA: Diagnosis not present

## 2017-10-26 DIAGNOSIS — E7800 Pure hypercholesterolemia, unspecified: Secondary | ICD-10-CM | POA: Diagnosis not present

## 2017-10-31 DIAGNOSIS — Z0000 Encounter for general adult medical examination without abnormal findings: Secondary | ICD-10-CM | POA: Diagnosis not present

## 2017-10-31 DIAGNOSIS — E559 Vitamin D deficiency, unspecified: Secondary | ICD-10-CM | POA: Diagnosis not present

## 2017-10-31 DIAGNOSIS — E7800 Pure hypercholesterolemia, unspecified: Secondary | ICD-10-CM | POA: Diagnosis not present

## 2017-10-31 DIAGNOSIS — Z86711 Personal history of pulmonary embolism: Secondary | ICD-10-CM | POA: Diagnosis not present

## 2018-04-30 DIAGNOSIS — E7800 Pure hypercholesterolemia, unspecified: Secondary | ICD-10-CM | POA: Diagnosis not present

## 2018-04-30 DIAGNOSIS — E559 Vitamin D deficiency, unspecified: Secondary | ICD-10-CM | POA: Diagnosis not present

## 2018-10-26 DIAGNOSIS — E559 Vitamin D deficiency, unspecified: Secondary | ICD-10-CM | POA: Diagnosis not present

## 2018-10-26 DIAGNOSIS — Z0000 Encounter for general adult medical examination without abnormal findings: Secondary | ICD-10-CM | POA: Diagnosis not present

## 2018-11-02 DIAGNOSIS — E7800 Pure hypercholesterolemia, unspecified: Secondary | ICD-10-CM | POA: Diagnosis not present

## 2018-11-02 DIAGNOSIS — Z0000 Encounter for general adult medical examination without abnormal findings: Secondary | ICD-10-CM | POA: Diagnosis not present

## 2018-11-02 DIAGNOSIS — Z01419 Encounter for gynecological examination (general) (routine) without abnormal findings: Secondary | ICD-10-CM | POA: Diagnosis not present

## 2018-11-15 DIAGNOSIS — Z86711 Personal history of pulmonary embolism: Secondary | ICD-10-CM | POA: Diagnosis not present

## 2018-11-15 DIAGNOSIS — Z7901 Long term (current) use of anticoagulants: Secondary | ICD-10-CM | POA: Diagnosis not present

## 2018-11-15 DIAGNOSIS — R030 Elevated blood-pressure reading, without diagnosis of hypertension: Secondary | ICD-10-CM | POA: Diagnosis not present

## 2019-02-11 DIAGNOSIS — J309 Allergic rhinitis, unspecified: Secondary | ICD-10-CM | POA: Diagnosis not present

## 2019-02-11 DIAGNOSIS — M797 Fibromyalgia: Secondary | ICD-10-CM | POA: Diagnosis not present

## 2022-04-22 ENCOUNTER — Other Ambulatory Visit: Payer: Self-pay

## 2022-04-22 ENCOUNTER — Encounter (HOSPITAL_COMMUNITY): Payer: Self-pay | Admitting: Emergency Medicine

## 2022-04-22 ENCOUNTER — Emergency Department (HOSPITAL_COMMUNITY)
Admission: EM | Admit: 2022-04-22 | Discharge: 2022-04-22 | Disposition: A | Discharge status: Home / Self Care | Payer: BC Managed Care – PPO | Attending: Emergency Medicine | Admitting: Emergency Medicine

## 2022-04-22 DIAGNOSIS — I10 Essential (primary) hypertension: Secondary | ICD-10-CM | POA: Insufficient documentation

## 2022-04-22 DIAGNOSIS — T6594XA Toxic effect of unspecified substance, undetermined, initial encounter: Secondary | ICD-10-CM | POA: Diagnosis present

## 2022-04-22 HISTORY — DX: Essential (primary) hypertension: I10

## 2022-04-22 HISTORY — DX: Hyperlipidemia, unspecified: E78.5

## 2022-04-22 MED ORDER — LORAZEPAM 1 MG PO TABS
2.0000 mg | ORAL_TABLET | Freq: Once | ORAL | Status: AC
  Administered 2022-04-22: 2 mg via ORAL
  Filled 2022-04-22: qty 2

## 2022-04-22 NOTE — Discharge Instructions (Signed)
You were evaluated in the Emergency Department and after careful evaluation, we did not find any emergent condition requiring admission or further testing in the hospital.  Your exam/testing today is overall reassuring.  Please return to the Emergency Department if you experience any worsening of your condition.   Thank you for allowing us to be a part of your care. 

## 2022-04-22 NOTE — ED Triage Notes (Signed)
C/o consuming half of an edible gummy and is now having paranoia. Pt doesn't "feel right"   EMS: 144/86 HR 130s -140s 99% RA

## 2022-04-22 NOTE — ED Provider Notes (Signed)
[facility]-EMERGENCY DEPT [facility] Emergency Department Provider Note MRN:  [id number]  Arrival date & time: 04/22/22     Chief Complaint   Drug Overdose   History of Present Illness   Leah Campos is a 51 y.o. year-old female with no pertinent past medical presenting to the ED with chief complaint of drug overdose.  Took half of a THC gummy this evening and now feels paranoid, anxious.  Patient is currently resting comfortably, hesitant to talk.  Review of Systems  I was unable to obtain a full/accurate HPI, PMH, or ROS due to the patient's altered mental status.  Patient's Health History    Past Medical History:  Diagnosis Date   Acid reflux    Allergy    Anxiety    Clotting disorder (H[facility])    Depression    H/O blood clots    Hypercholesteremia    Hyperlipidemia    Hypertension    Panic attacks     Past Surgical History:  Procedure Laterality Date   ABDOMINAL HYSTERECTOMY     partial   BREAST ENHANCEMENT SURGERY     TONSILLECTOMY      History reviewed. No pertinent family history.  Social History   Socioeconomic History   Marital status: Single    Spouse name: Not on file   Number of children: Not on file   Years of education: Not on file   Highest education level: Not on file  Occupational History   Not on file  Tobacco Use   Smoking status: Never   Smokeless tobacco: Never  Substance and Sexual Activity   Alcohol use: Yes    Comment: rarely   Drug use: No    Comment: gummies   Sexual activity: Never    Birth control/protection: Abstinence  Other Topics Concern   Not on file  Social History Narrative   Not on file   Social Determinants of Health   Financial Resource Strain: Not on file  Food Insecurity: Not on file  Transportation Needs: Not on file  Physical Activity: Not on file  Stress: Not on file  Social Connections: Not on file  Intimate Partner Violence: Not on file     Physical Exam   Vitals:   04/22/22 0530 04/22/22 0600   BP: 134/84 125/83  Pulse: (!) 123 (!) 126  Resp: 20 18  Temp:    SpO2: 98% 97%    CONSTITUTIONAL: Well-appearing, NAD NEURO/PSYCH: Somnolent, wakes to voice, nonparticipatory in exam EYES:  eyes equal and reactive ENT/NECK:  no LAD, no JVD CARDIO: Tachycardic rate, well-perfused, normal S1 and S2 PULM:  CTAB no wheezing or rhonchi GI/GU:  non-distended, non-tender MSK/SPINE:  No gross deformities, no edema SKIN:  no rash, atraumatic   *Additional and/or pertinent findings included in MDM below  Diagnostic and Interventional Summary    EKG Interpretation  Date/Time:  Friday April 22 2022 02:30:35 EDT Ventricular Rate:  123 PR Interval:  132 QRS Duration: 90 QT Interval:  297 QTC Calculation: 425 R Axis:   56 Text Interpretation: Sinus tachycardia Probable left atrial enlargement Low voltage, precordial leads Borderline T abnormalities, diffuse leads Confirmed by Kennis Carina [phone]) on 04/22/2022 3:03:05 AM       Labs Reviewed - No data to display  No orders to display    Medications  LORazepam (ATIVAN) tablet 2 mg (2 mg Oral Given 04/22/22 0434)     Procedures  /  Critical Care Procedures  ED Course and Medical Decision Making  Initial  Impression and Ddx History fits clinical THC toxidrome, will monitor for period of time and reassess.  Past medical/surgical history that increases complexity of ED encounter: None  Interpretation of Diagnostics I personally reviewed the EKG and my interpretation is as follows: Sinus tachycardia    Patient Reassessment and Ultimate Disposition/Management     Patient became a bit anxious at 1 point with a heart rate between 140 and 150 and so Ativan was given.  On reassessment later in the morning, patient is doing much better, heart rate down to 118 on my assessment, able to converse, seems appropriate for discharge.  Patient management required discussion with the following services or consulting groups:  None  Complexity  of Problems Addressed Acute illness or injury that poses threat of life of bodily function  Additional Data Reviewed and Analyzed Further history obtained from: None  Additional Factors Impacting ED Encounter Risk None  Elmer Sow. Pilar Plate, MD [facility] Emergency Medicine [facility] [email or web address]  Final Clinical Impressions(s) / ED Diagnoses     ICD-10-CM   1. Ingestion of substance, undetermined intent, initial encounter  T65.94XA       ED Discharge Orders     None        Discharge Instructions Discussed with and Provided to Patient:     Discharge Instructions      You were evaluated in the Emergency Department and after careful evaluation, we did not find any emergent condition requiring admission or further testing in the hospital.  Your exam/testing today is overall reassuring.  Please return to the Emergency Department if you experience any worsening of your condition.   Thank you for allowing [place] to be a part of your care.       Sabas Sous, MD 04/22/22 [phone]
